# Patient Record
Sex: Female | Born: 1960 | Race: White | Hispanic: No | Marital: Married | Smoking: Never smoker
Health system: Southern US, Community
[De-identification: ages and names within clinical notes are randomized; demographics above are authoritative.]

## PROBLEM LIST (undated history)

## (undated) DIAGNOSIS — F0280 Dementia in other diseases classified elsewhere without behavioral disturbance: Secondary | ICD-10-CM

## (undated) DIAGNOSIS — E7800 Pure hypercholesterolemia, unspecified: Secondary | ICD-10-CM

## (undated) DIAGNOSIS — C4491 Basal cell carcinoma of skin, unspecified: Secondary | ICD-10-CM

## (undated) DIAGNOSIS — I10 Essential (primary) hypertension: Secondary | ICD-10-CM

## (undated) HISTORY — DX: Dementia in other diseases classified elsewhere, unspecified severity, without behavioral disturbance, psychotic disturbance, mood disturbance, and anxiety: F02.80

## (undated) HISTORY — DX: Pure hypercholesterolemia, unspecified: E78.00

## (undated) HISTORY — DX: Basal cell carcinoma of skin, unspecified: C44.91

## (undated) HISTORY — DX: Essential (primary) hypertension: I10

---

## 1997-12-02 ENCOUNTER — Inpatient Hospital Stay (HOSPITAL_COMMUNITY): Admission: AD | Admit: 1997-12-02 | Discharge: 1997-12-06 | Payer: Self-pay | Admitting: Obstetrics and Gynecology

## 1998-06-09 ENCOUNTER — Other Ambulatory Visit: Admission: RE | Admit: 1998-06-09 | Discharge: 1998-06-09 | Payer: Self-pay | Admitting: Obstetrics and Gynecology

## 1999-12-01 ENCOUNTER — Ambulatory Visit (HOSPITAL_COMMUNITY): Admission: RE | Admit: 1999-12-01 | Discharge: 1999-12-01 | Payer: Self-pay | Admitting: Obstetrics and Gynecology

## 2000-02-23 ENCOUNTER — Ambulatory Visit (HOSPITAL_COMMUNITY): Admission: RE | Admit: 2000-02-23 | Discharge: 2000-02-23 | Payer: Self-pay | Admitting: *Deleted

## 2000-05-14 ENCOUNTER — Inpatient Hospital Stay (HOSPITAL_COMMUNITY): Admission: AD | Admit: 2000-05-14 | Discharge: 2000-05-17 | Payer: Self-pay | Admitting: Obstetrics and Gynecology

## 2000-05-14 ENCOUNTER — Encounter (INDEPENDENT_AMBULATORY_CARE_PROVIDER_SITE_OTHER): Payer: Self-pay | Admitting: Specialist

## 2001-06-25 ENCOUNTER — Other Ambulatory Visit: Admission: RE | Admit: 2001-06-25 | Discharge: 2001-06-25 | Payer: Self-pay | Admitting: Obstetrics and Gynecology

## 2002-07-11 ENCOUNTER — Other Ambulatory Visit: Admission: RE | Admit: 2002-07-11 | Discharge: 2002-07-11 | Payer: Self-pay | Admitting: Obstetrics and Gynecology

## 2003-01-28 ENCOUNTER — Encounter: Admission: RE | Admit: 2003-01-28 | Discharge: 2003-01-28 | Payer: Self-pay | Admitting: Family Medicine

## 2003-01-28 ENCOUNTER — Encounter: Payer: Self-pay | Admitting: Family Medicine

## 2003-09-09 ENCOUNTER — Other Ambulatory Visit: Admission: RE | Admit: 2003-09-09 | Discharge: 2003-09-09 | Payer: Self-pay | Admitting: Obstetrics and Gynecology

## 2004-10-19 ENCOUNTER — Other Ambulatory Visit: Admission: RE | Admit: 2004-10-19 | Discharge: 2004-10-19 | Payer: Self-pay | Admitting: Obstetrics and Gynecology

## 2007-03-06 ENCOUNTER — Encounter: Admission: RE | Admit: 2007-03-06 | Discharge: 2007-03-06 | Payer: Self-pay | Admitting: Obstetrics and Gynecology

## 2007-12-11 ENCOUNTER — Encounter: Admission: RE | Admit: 2007-12-11 | Discharge: 2007-12-11 | Payer: Self-pay | Admitting: Obstetrics and Gynecology

## 2010-04-30 ENCOUNTER — Encounter: Payer: Self-pay | Admitting: Family Medicine

## 2010-05-01 ENCOUNTER — Encounter: Payer: Self-pay | Admitting: Obstetrics and Gynecology

## 2010-08-26 NOTE — H&P (Signed)
[facility]  Patient:    Sharon Evans, Sharon Evans                        MRN: [id number] Adm. Date:  05/14/00 Attending:  Juluis Mire, M.D.                         History and Physical  HISTORY:                      The patient is a 50 year old gravida 3, para 1, AB 1, married white female with last menstrual period on Aug 15, 1999, giving an estimated date of confinement of May 20, 2000.  This gives her estimated gestational age of [redacted] weeks, consistent with initial examination and ultrasound.  The patient presents for a repeat cesarean section and bilateral tubal ligation.  The patient had a prior low transverse cesarean section for a breech presentation with the last pregnancy.  We had discussed with her the possible trial of labor, which she does decline, and presents now for a repeat cesarean section.  Other complicating factors include advanced maternal age.  She did undergo amniocentesis, volume and chromosomally normal female.  Maternal amniotic fluid alpha fetoprotein was also within normal limits.  Her prenatal course was otherwise uncomplicated.  She is desirous of a permanent sterilization.  Alternative forms of birth control have been discussed.  The potentially reversibility of the sterilization is explained.  A failure rate of 1/200 is quoted.  This can be in the form of ectopic pregnancy, requiring further surgical management.  ALLERGIES:                    No known drug allergies.  CURRENT MEDICATIONS:          Prenatal vitamins.  PAST MEDICAL HISTORY/FAMILY HISTORY/SOCIAL HISTORY:  Please see the prenatal records.  REVIEW OF SYSTEMS:            Noncontributory.  PHYSICAL EXAMINATION:  VITAL SIGNS:                  Afebrile with stable vital signs.  HEENT:                        The patient is normocephalic.  Pupils equal, round, reactive to light and accommodation.  Extraocular movements intact. Sclerae and conjunctivae clear.   Oropharynx clear.  NECK:                         Without thyromegaly.  BREASTS:                      Glandular, but no discrete masses.  LUNGS:                        Clear.  CARDIAC:                      A regular rhythm and rate with a grade 2/6 systolic ejection murmur.  No clicks or gallops.  ABDOMEN:                      Gravid uterus, consistent with dates.  Fetal heart tones are audible.  PELVIC:  Deferred.  EXTREMITIES:                  Trace edema.  NEUROLOGIC:                   Grossly within normal limits.  IMPRESSION:                   1. Intrauterine pregnancy at term, with prior                                  cesarean section, desires repeat.                               2. Multiparity, desires sterility.  PLAN:                         The patient is to undergo a repeat cesarean section and bilateral tubal ligation.  The risks of surgery have been discussed, including the risk of anesthesia, the risk of infection, the risk of hemorrhage that could necessitate transfusion, with the risk of AIDS or hepatitis, the risk of injury to adjacent organs, including bladder, bowel, or ureters, which require further exploratory surgery and management, the risk of deep vein thrombosis and pulmonary embolus.  The patient expressed an understanding of the indications and risks. DD:  05/14/00 TD:  05/14/00 Job: [id number]

## 2010-08-26 NOTE — Op Note (Signed)
[facility]  Patient:    Sharon Evans, Sharon Evans                      MRN: [id number] Proc. Date: 05/14/00 Adm. Date:  [id number] Attending:  Frederich Balding                           Operative Report  PREOPERATIVE DIAGNOSES:       1. Uterine pregnancy at term with prior cesarean                                  section, desires repeat.                               2. Multiparity, desires sterility.  POSTOPERATIVE DIAGNOSES:      1. Uterine pregnancy at term with prior cesarean                                  section, desires repeat.                               2. Multiparity, desires sterility.  OPERATION:                    1. Low transverse cesarean section.                               2. Bilateral tubal ligation.  SURGEON:                      Juluis Mire, M.D.  ASSISTANT:                    Beather Arbour. Thomasena Edis, M.D., Ph.D.  Bronwen Betters BLOOD LOSS:         800 cc.  PACKS/DRAINS:                 None.  INTRAOPERATIVE BLOOD PLACED:  None.  COMPLICATIONS:                None.  INDICATIONS:                  As dictated in the history and physical.  DESCRIPTION OF PROCEDURE:     The patient was taken to the operating room and placed in the supine position with a left lateral tilt.  After the satisfactory level of spinal anesthesia was obtained, the abdomen was prepped out with Betadine and draped as a sterile field.  The prior low transverse skin incision was identified and excised.  The incision was then extended through the subcutaneous tissue.  The anterior rectus fascia was entered sharply and the incisions in the fascia extended laterally.  The fascia was taken off of the muscles superiorly and inferiorly.  The rectus muscles were separated in the midline.  The perineum was entered sharply, and the incision in the perineum was extended both superiorly and inferiorly.  There was no evidence of any intra-abdominal adhesions.  A low  transverse bladder flap was developed.  A low transverse uterine incision was then begun with the knife  and extended laterally using manual traction.  The infant did present in the vertex presentation, and was delivered with elevation of the head and fundal pressure.  The infant was a viable female who weighed 8 pounds and 9 ounces. The Apgars were 8/9.  The umbilical artery pH is still pending. The amniotic fluid was noted to be clear.  There was one nuchal cord that was easily reduced.  The placenta was then delivered manually and felt to be unremarkable.  The uterus was closed with interlocking sutures of #0 chromic using a two-layer technique.  With this we had good hemostasis.  The uterus was then exteriorized.  A small subserosal fibroid was noted.  Each tube was identified and elevated.  Holes made in the avascular area of the mesosalpinx. Individual ligatures of #0 plain catgut were used to ligate off the segment of tube.  The intervening segment of tube was then excised, and the cut ends of the tubes were cauterized.  We had good separation and hemostasis.  The ovaries appeared normal.  The uterus was then returned to the abdominal cavity.  The pelvic cavity was thoroughly irrigated.  Our hemostasis was excellent.  The urine output was clear and adequate.  The muscles were reapproximated with running suture of #3-0 Vicryl, and the fascia was closed with running sutures of #0 Panacryl.  The skin was closed with staples and Steri-Strips.  The sponge, instrument, and needle counts were reported as correct by the circulating nurse x 2.  The Foley catheter remained clear at the time of closure.  The patient tolerated the procedure well and was returned to the recovery room in good condition. DD:  05/14/00 TD:  05/14/00 Job: [id number]

## 2010-08-26 NOTE — Discharge Summary (Signed)
[facility]   Patient:    Sharon Evans, Sharon Evans                      MRN: [id number] Adm. Date:  [id number] Disc. Date: [id number] Attending:  Frederich Balding Dictator:   Danie Chandler, R.N.                           Discharge Summary  ADMISSION DIAGNOSES:          1. Intrauterine pregnancy at term with prior                                  cesarean section, desires repeat.                               2. Multiparity, desires sterility.  DISCHARGE DIAGNOSES:          1. Intrauterine pregnancy at term with prior                                  cesarean section, desires repeat.                               2. Multiparity, desires sterility.  PROCEDURES:                   On May 14, 2000, repeat low transverse cesarean section and bilateral tubal ligation.  REASON FOR ADMISSION:         Please see the dictated H&P.  HOSPITAL COURSE:              The patient was taken to the operating room and underwent the above-named procedure without complication.  This was productive of a viable female infant with Apgars of 8 at one minute and 9 at five minutes. Postoperatively on day #1, the patients hemoglobin was 10.1, hematocrit 29.4, and white blood cell count 10.1.  The patient was ambulating well in the halls.  On postoperative day #2, the patient had a good return of bowel function and was tolerating a regular diet.  She also had good pain control.  DISPOSITION:                  She was discharged home on postoperative day #3.  CONDITION ON DISCHARGE:       Good.  DIET:                         Regular as tolerated.  ACTIVITY:                     No heavy lifting, no driving, and no vaginal entry.  FOLLOW-UP:                    She is to follow up in the office in one to two weeks for an incision check.  DISCHARGE INSTRUCTIONS:       She is to call for temperature greater than 100 degrees, persistent nausea or vomiting, heavy vaginal bleeding,  and/or redness or drainage from the incision site.  DISCHARGE MEDICATIONS:        1. Prenatal  vitamins one p.o. q.d.                               2. Percocet 5 mg, #30, one to two p.o. q.4-6h.                                  p.r.n. pain. DD:  05/17/00 TD:  05/18/00 Job: [id number]

## 2013-12-02 ENCOUNTER — Other Ambulatory Visit: Payer: Self-pay | Admitting: Family Medicine

## 2013-12-02 ENCOUNTER — Ambulatory Visit
Admission: RE | Admit: 2013-12-02 | Discharge: 2013-12-02 | Disposition: A | Discharge status: Home / Self Care | Payer: Managed Care, Other (non HMO) | Source: Ambulatory Visit | Attending: Family Medicine | Admitting: Family Medicine

## 2013-12-02 DIAGNOSIS — M79645 Pain in left finger(s): Secondary | ICD-10-CM

## 2014-06-12 ENCOUNTER — Other Ambulatory Visit: Payer: Self-pay

## 2014-06-12 DIAGNOSIS — Z1231 Encounter for screening mammogram for malignant neoplasm of breast: Secondary | ICD-10-CM

## 2014-08-13 ENCOUNTER — Ambulatory Visit
Admission: RE | Admit: 2014-08-13 | Discharge: 2014-08-13 | Disposition: A | Discharge status: Home / Self Care | Payer: Managed Care, Other (non HMO) | Source: Ambulatory Visit

## 2014-08-13 DIAGNOSIS — Z1231 Encounter for screening mammogram for malignant neoplasm of breast: Secondary | ICD-10-CM

## 2016-04-17 DIAGNOSIS — Z01419 Encounter for gynecological examination (general) (routine) without abnormal findings: Secondary | ICD-10-CM | POA: Diagnosis not present

## 2016-04-17 DIAGNOSIS — Z6823 Body mass index (BMI) 23.0-23.9, adult: Secondary | ICD-10-CM | POA: Diagnosis not present

## 2016-04-21 DIAGNOSIS — Z13820 Encounter for screening for osteoporosis: Secondary | ICD-10-CM | POA: Diagnosis not present

## 2016-05-02 DIAGNOSIS — M810 Age-related osteoporosis without current pathological fracture: Secondary | ICD-10-CM | POA: Diagnosis not present

## 2016-05-02 DIAGNOSIS — M818 Other osteoporosis without current pathological fracture: Secondary | ICD-10-CM | POA: Diagnosis not present

## 2016-05-04 DIAGNOSIS — E7800 Pure hypercholesterolemia, unspecified: Secondary | ICD-10-CM | POA: Diagnosis not present

## 2016-05-04 DIAGNOSIS — R413 Other amnesia: Secondary | ICD-10-CM | POA: Diagnosis not present

## 2016-05-04 DIAGNOSIS — I10 Essential (primary) hypertension: Secondary | ICD-10-CM | POA: Diagnosis not present

## 2016-05-04 DIAGNOSIS — Z0000 Encounter for general adult medical examination without abnormal findings: Secondary | ICD-10-CM | POA: Diagnosis not present

## 2016-05-24 IMAGING — MG MM SCREEN MAMMOGRAM BILATERAL
5 series · 5 of 5 positions shown · non-contrast
Comparison: Previous exam(s).

CLINICAL DATA: Screening.

EXAM:
DIGITAL SCREENING BILATERAL MAMMOGRAM WITH CAD

[R CC]
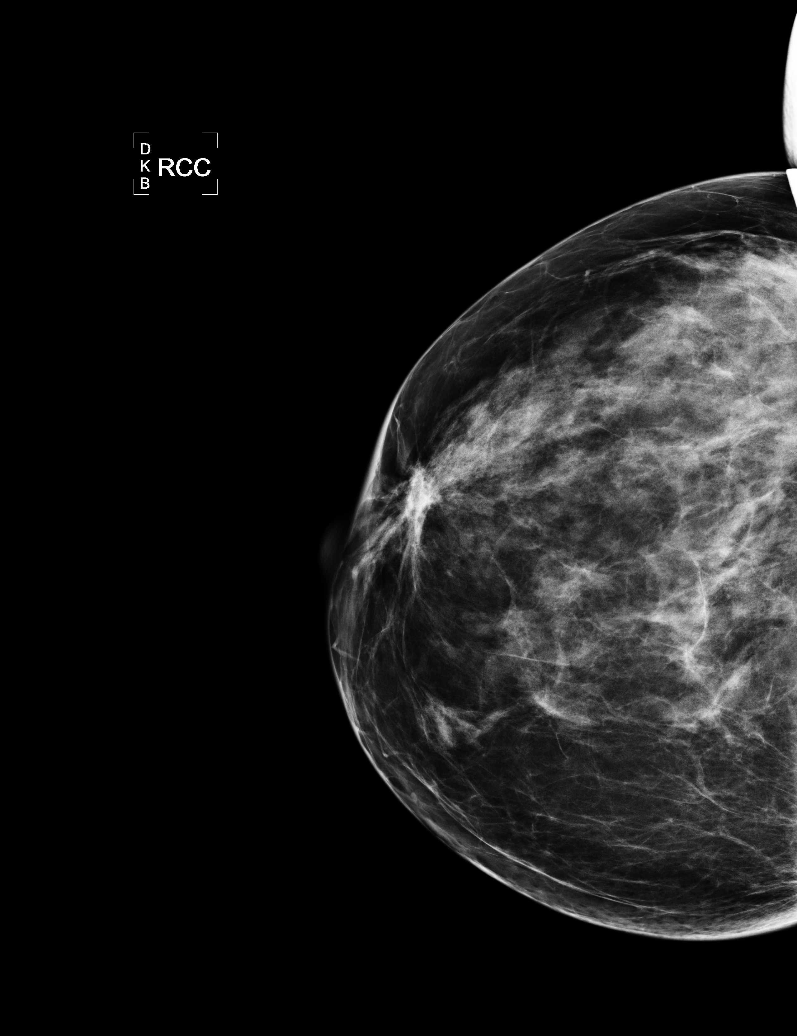

[L CC]
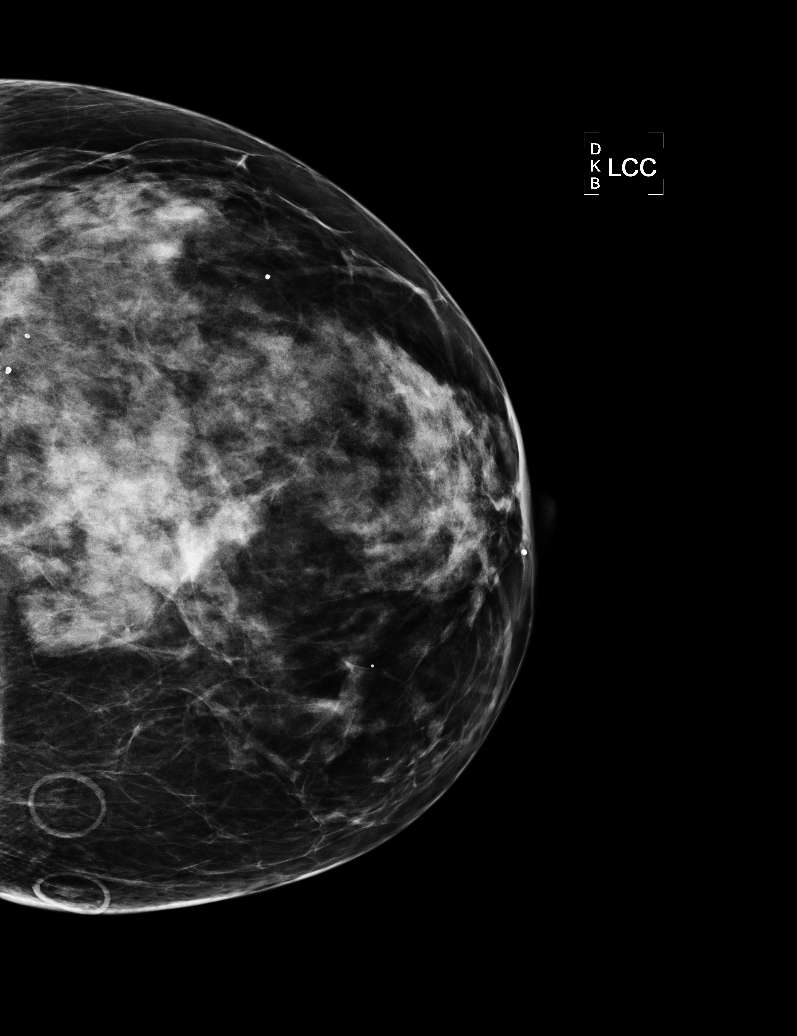

[L MLO (1 of 2)]
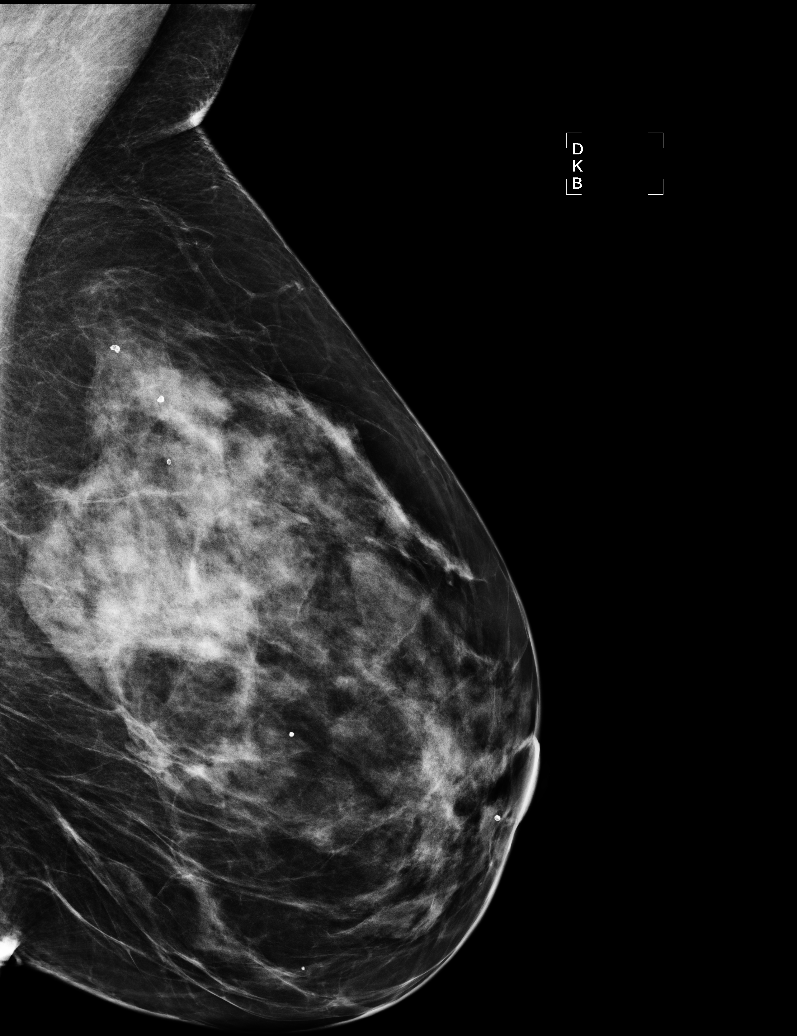

[R MLO]
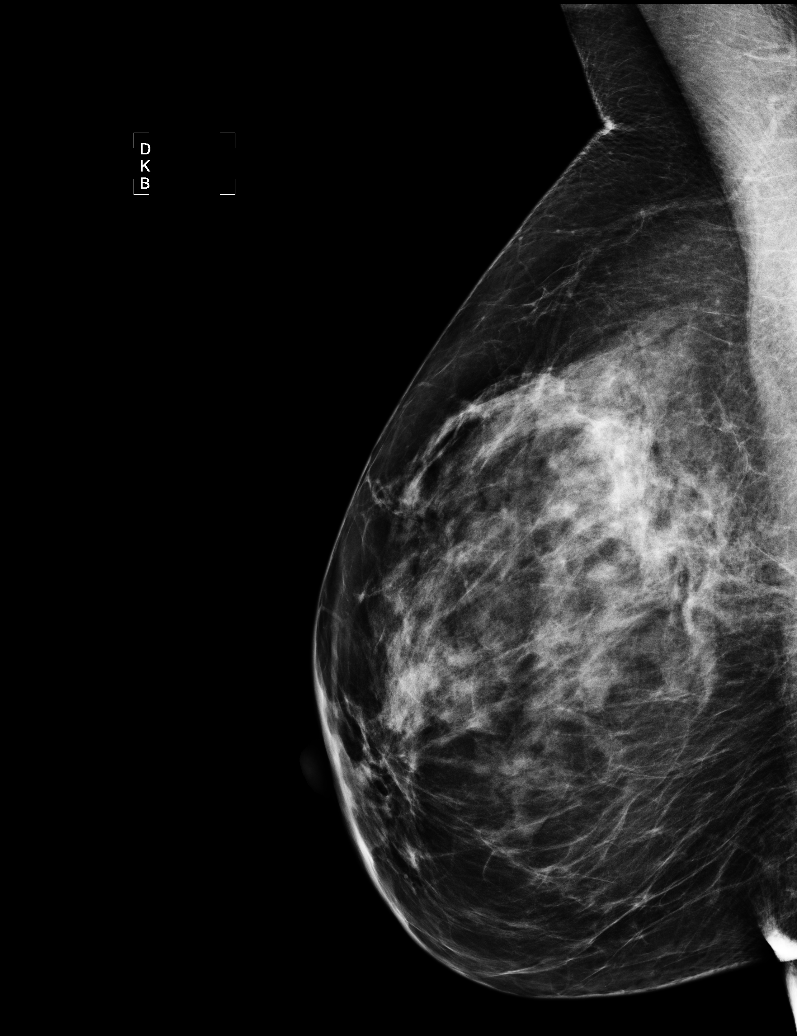

[L MLO (2 of 2)]
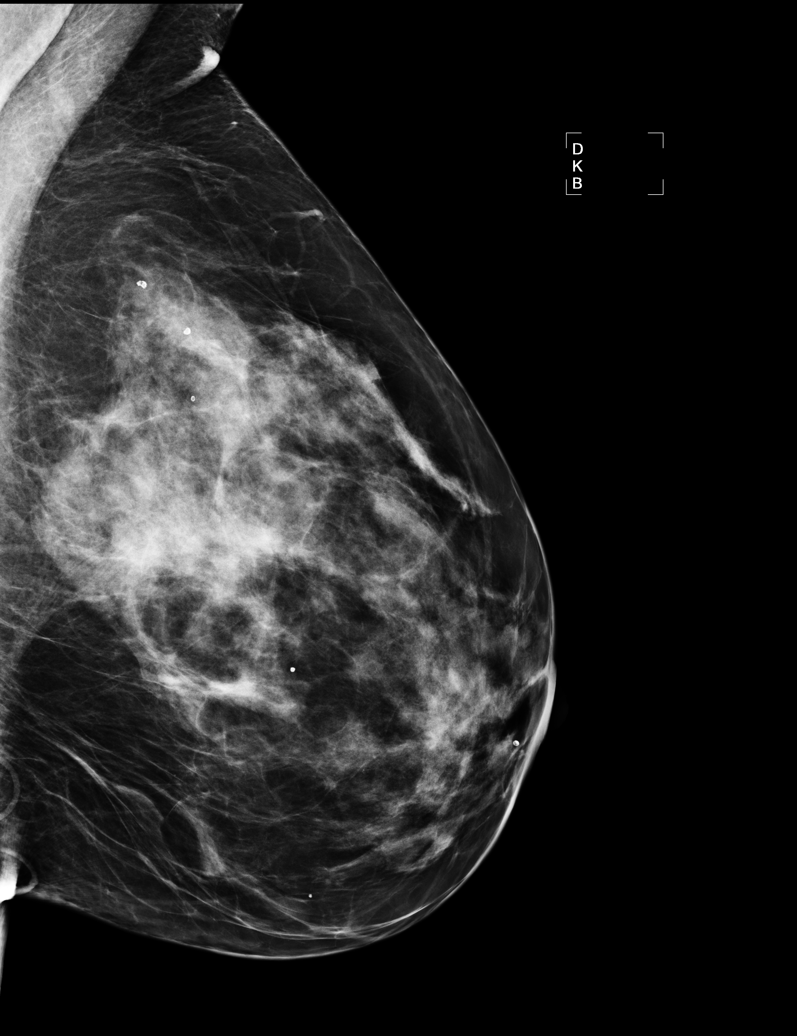

[5 of 5 positions shown; findings below may reference images not displayed]

ACR Breast Density Category c: The breast tissue is heterogeneously
dense, which may obscure small masses.
FINDINGS: There are no findings suspicious for malignancy. Images were
processed with CAD.
IMPRESSION: No mammographic evidence of malignancy. A result letter of this
screening mammogram will be mailed directly to the patient.

RECOMMENDATION:
Screening mammogram in one year. (Code:YJ-2-FEZ)

BI-RADS CATEGORY  1: Negative.

## 2016-06-29 DIAGNOSIS — F411 Generalized anxiety disorder: Secondary | ICD-10-CM | POA: Diagnosis not present

## 2016-07-31 DIAGNOSIS — E559 Vitamin D deficiency, unspecified: Secondary | ICD-10-CM | POA: Diagnosis not present

## 2017-01-29 DIAGNOSIS — R413 Other amnesia: Secondary | ICD-10-CM | POA: Diagnosis not present

## 2017-01-29 DIAGNOSIS — F411 Generalized anxiety disorder: Secondary | ICD-10-CM | POA: Diagnosis not present

## 2017-01-29 DIAGNOSIS — R251 Tremor, unspecified: Secondary | ICD-10-CM | POA: Diagnosis not present

## 2017-01-29 DIAGNOSIS — R634 Abnormal weight loss: Secondary | ICD-10-CM | POA: Diagnosis not present

## 2017-01-30 ENCOUNTER — Encounter: Payer: Self-pay | Admitting: Neurology

## 2017-02-06 ENCOUNTER — Ambulatory Visit (INDEPENDENT_AMBULATORY_CARE_PROVIDER_SITE_OTHER): Payer: BLUE CROSS/BLUE SHIELD | Admitting: Neurology

## 2017-02-06 ENCOUNTER — Encounter: Payer: Self-pay | Admitting: Neurology

## 2017-02-06 VITALS — BP 160/90 | HR 77 | Ht 64.5 in | Wt 123.8 lb

## 2017-02-06 DIAGNOSIS — R4189 Other symptoms and signs involving cognitive functions and awareness: Secondary | ICD-10-CM | POA: Diagnosis not present

## 2017-02-06 DIAGNOSIS — R030 Elevated blood-pressure reading, without diagnosis of hypertension: Secondary | ICD-10-CM | POA: Diagnosis not present

## 2017-02-06 DIAGNOSIS — R413 Other amnesia: Secondary | ICD-10-CM | POA: Diagnosis not present

## 2017-02-06 MED ORDER — DIAZEPAM 5 MG PO TABS: ORAL_TABLET | ORAL | 0 refills | Status: DC
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
## 2017-02-06 NOTE — Patient Instructions (Addendum)
1.  We will check MRI of brain with and without contrast.  Take Valium 5mg  15 to 30 minutes prior to MRI 2.  We will also schedule a neurocognitive test with Dr. Si Raider 3.  No driving at this time 4.  Follow up with me after testing. 5.  Follow up blood pressure with Dr. Marisue Humble.

## 2017-02-19 ENCOUNTER — Ambulatory Visit
Admission: RE | Admit: 2017-02-19 | Discharge: 2017-02-19 | Disposition: A | Discharge status: Home / Self Care | Payer: BLUE CROSS/BLUE SHIELD | Source: Ambulatory Visit | Attending: Neurology | Admitting: Neurology

## 2017-02-19 DIAGNOSIS — R413 Other amnesia: Secondary | ICD-10-CM

## 2017-02-19 DIAGNOSIS — R4189 Other symptoms and signs involving cognitive functions and awareness: Secondary | ICD-10-CM

## 2017-02-19 MED ORDER — GADOBENATE DIMEGLUMINE 529 MG/ML IV SOLN
10.0000 mL | Freq: Once | INTRAVENOUS | Status: AC | PRN
  Administered 2017-02-19: 10 mL via INTRAVENOUS

## 2017-02-22 ENCOUNTER — Telehealth: Payer: Self-pay | Admitting: Neurology

## 2017-02-22 ENCOUNTER — Telehealth: Payer: Self-pay

## 2017-02-22 NOTE — Telephone Encounter (Signed)
-----   Message from Pieter Partridge, DO sent at 02/20/2017  7:19 AM EST ----- The MRI of the brain does not reveal anything specific that would cause memory problems.  She should follow up with Dr. Si Raider as scheduled and then with me.

## 2017-02-22 NOTE — Telephone Encounter (Signed)
Called and spoke with Husband, [name]. He wants to be placed on cx list for neuropsych testing

## 2017-02-22 NOTE — Telephone Encounter (Signed)
Patient's husband called and would like you to call him on his Cell phone. Thanks

## 2017-02-22 NOTE — Telephone Encounter (Signed)
Called Pt, LM on VM advisig Pt of MRI results and to follow-up with Dr Richrd Sox and Tomi Likens

## 2017-02-22 NOTE — Telephone Encounter (Signed)
Want to know the results of the MRI please call

## 2017-02-27 ENCOUNTER — Telehealth: Payer: Self-pay

## 2017-02-27 ENCOUNTER — Telehealth: Payer: Self-pay | Admitting: Neurology

## 2017-02-27 MED ORDER — DONEPEZIL HCL 5 MG PO TABS: 5.0000 mg | ORAL_TABLET | Freq: Every day | ORAL | 0 refills | Status: DC

## 2017-02-27 MED ORDER — DONEPEZIL HCL 5 MG PO TABS: 5.0000 mg | ORAL_TABLET | Freq: Every day | ORAL | 3 refills | Status: DC

## 2017-02-27 NOTE — Telephone Encounter (Signed)
Bill rtrnd call, advsd him of 03/19/17 date and to arrive at 1:45p, advsd him also will be here for a couple of hours. He wants Aricept sent to [facility].

## 2017-02-27 NOTE — Addendum Note (Signed)
Addended by: Clois Comber on: 02/27/2017 09:55 AM   Modules accepted: Orders

## 2017-02-27 NOTE — Telephone Encounter (Signed)
Called Pts husband, [name], [place] on VM advsing the neurocognitive testing date has been moved up to 03/19/17, also we need which pharmacy to call in Aricept.

## 2017-02-27 NOTE — Telephone Encounter (Signed)
I called and spoke with Mr. Louie Bun regarding his wife's MRI.  There is nothing specific on the test.  At this point, she is scheduled for neuropsych testing in April.  I will see if I can get that appointment moved up.  If not, then I would like to refer her out to another practice, such as [facility], if a sooner appointment is available.  In the meantime, we will start donepezil.  I discussed the function and side effects.  We will start 5mg  at bedtime for one month.  If tolerating, he will call for refill of 10mg  dose.

## 2017-03-19 ENCOUNTER — Encounter: Payer: BLUE CROSS/BLUE SHIELD | Admitting: Psychology

## 2017-03-23 ENCOUNTER — Ambulatory Visit (INDEPENDENT_AMBULATORY_CARE_PROVIDER_SITE_OTHER): Payer: BLUE CROSS/BLUE SHIELD | Admitting: Psychology

## 2017-03-23 ENCOUNTER — Encounter: Payer: Self-pay | Admitting: Psychology

## 2017-03-23 DIAGNOSIS — R413 Other amnesia: Secondary | ICD-10-CM
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
## 2017-03-24 NOTE — Progress Notes (Signed)
   Neuropsychology Note  Sharon Evans came in today for 1 hour of neuropsychological testing with technician, Milana Kidney, BS, under the supervision of Dr. Macarthur Critchley. The patient did not appear overtly distressed by the testing session, per behavioral observation or via self-report to the technician. Rest breaks were offered. Sharon Evans will return within 2 weeks for a feedback session with Dr. Si Raider at which time her test performances, clinical impressions and treatment recommendations will be reviewed in detail. The patient understands she can contact our office should she require our assistance before this time.  Full report to follow.

## 2017-03-25 ENCOUNTER — Encounter: Payer: Self-pay | Admitting: Psychology

## 2017-04-15 NOTE — Progress Notes (Addendum)
NEUROPSYCHOLOGICAL EVALUATION   Name:    Sharon Evans  Date of Birth:   1960/11/19 Date of Interview:  03/23/2017 Date of Testing:  03/23/2017   Date of Feedback:  04/16/2017       Background Information:  Reason for Referral:  Sharon Evans is a 57 y.o. right-handed female referred by Dr. Metta Clines to assess her current level of cognitive functioning and assist in differential diagnosis. The current evaluation consisted of a review of available medical records, an interview with the patient and her husband, [name], and the completion of a neuropsychological testing battery. Informed consent was obtained.  History of Presenting Problem:  Earlier this year the patient apparently saw Dr. Marisue Humble for memory concerns. She appeared anxious and tremulous so she was prescribed Lexapro. Her husband didn't initially know about that visit or her concern of memory loss. Then he became concerned about her memory when, on a few occasions, she had tried to unlock her car to take it somewhere and her husband had to remind her that she didn't have the keys. They went to see Dr. Marisue Humble together, and that's when the patient was referred to Dr. Tomi Likens for further evaluation. Sharon Evans saw Dr. Tomi Likens on 02/06/2017. 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 Brain MRI was performed on 02/19/2017 and reportedly revealed no specific or reversible explanation for memory loss, mild cortical volume loss along the convexities, normal medial temporal volume, and no notable ischemic change.   At today's visit (03/23/2017), the patient has difficulty providing information about her current cognitive functioning. When asked if she has concerns about her memory/cognition, she replies "somewhat". She explained that it is not an every-day concern, and she can function pretty independently. She is unable to provide any examples of even episodic  cognitive changes. Her husband reports that a major issue is word finding difficulty. She often is unable to come up with the last one or two words of a sentence. He says this happens at least twice a day. He doesn't notice any word substitution problems or paraphasic errors. He doesn't notice any comprehension or receptive language problems. The patient states that she has trouble writing now and actually has pretty much stopped writing. Her husband states she used to have beautiful penmanship. 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 She admits she may forget details of recent conversations. She does not seem to forget recent events. She denies any problems with concentration/attention or staying on task. Her husband notes that while she was never a tech-savvy person, she does seem to need more help even with the basics of her smart phone lately. For example, she has asked where to locate her text messages.   She denies any difficulty with visual-spatial navigation or history of getting lost. Dr. Georgie Chard note indicated that her husband has not been concerned riding in the passenger seat with her, but that one time she was driving in the car with her son when she missed a turn and had to drive the wrong way on a street until she was able to maneuver and turn around. Due to her poor performance on the MoCA, Dr. Tomi Likens advised that she stop driving. She has not driven since then. This of course is frustrating to her as she feels she has much less independence and is "going stir-crazy" in the house. Her husband  states that she has good awareness of routes and where they are when she is a passenger with him.   Psychiatric history, including history of depression or mental health treatment for any issue, was denied.  There is no family history of dementia (including early onset dementia). Her father was very young when he passed away. Her mother  passed in her 43s and did not have dementia. Her brother passed away 9 years ago; he had heart issues and depression but no cognitive problems.   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 Most of the work can be done from home. She reports she enjoys it very much. She denied any problems doing her job earlier this year, and she hopes to return in 2019 when the season comes around again.   As noted previously, she is not currently driving, per Dr. Georgie Chard recommendation. She manages her medications independently and denies any difficulty with this. Her husband has always managed the majority of the finances/bills (he is a Customer service manager), but she always managed her credit cards. There was an issue sometime in the past 6 months when she didn't seem aware of her credit card balance and did not pay it off, which is new. She independently manages her appointments and denies any problems with this or missed appointments. She never enjoyed cooking and never did very much of it, but now her husband is doing pretty much all of it. Per Dr. Georgie Chard note, on a couple of occasions, she heated food in a ceramic bowl on the stove and burnt the bowl.   The patient denies any physical complaints or pain at the present time. She denies trouble with balance or changes in gait. She denies any sleep difficulties. Her husband has not noticed any increased movement in sleep or signs of REM sleep behavior disorder. He states she does seem to go to bed earlier than she used to.   She reports her appetite is fine. Her husband has been concerned about her losing weight. She has lost weight over the past 2 years but she states this was intentional and she doesn't think it is "overdone". She does eat fairly healthy foods, per her husband. She does not engage in any regular physical exercise.  She reports her current mood is good  but "a little stir crazy". Her husband denies any change in mood, irritability or depression. He feels her "spirits have been good". It is difficult to assess her anxiety level. She appears anxious/fidgety, but she denies feeling anxious or jittery. She denies feeling particularly worried. Her husband notes that she has made more comments lately about different things stressing her out. He has not observed any personality changes or changes in interpersonal/social functioning. He notes one behavioral change in that she used to be more particular with her clothes and would hang them up, but now she seems to leave them folded in piles.   Social History: Born/Raised: [place] Education: Bachelors degree from 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 More recently, she has done seasonal work from home for [employer], for at least 5-6 years. Marital history: Married x20 years. Two sons- one is a Paramedic in [employer], other is in college at [employer] Alcohol: Minimal, maybe one beer once a week (social) Tobacco: Never   Medical History: No past medical history on file.  Current medications:  Outpatient  Encounter Medications as of 04/16/2017  Medication Sig  . alendronate (FOSAMAX) 70 MG tablet Take 70 mg by mouth once a week.  . diazepam (VALIUM) 5 MG tablet Take 15 to 30 minute prior to MRI  . donepezil (ARICEPT) 5 MG tablet Take 1 tablet (5 mg total) at bedtime by mouth.  . donepezil (ARICEPT) 5 MG tablet Take 1 tablet (5 mg total) at bedtime by mouth.  . escitalopram (LEXAPRO) 10 MG tablet Take 10 mg by mouth daily.   No facility-administered encounter medications on file as of 04/16/2017.      Current Examination:  Behavioral Observations:  Appearance: Neatly, casually and appropriately dressed and groomed Gait: Ambulated independently, no gross abnormalities observed Speech: Aphasic/dysfluent. Significant word finding difficulty and  difficulty expressing ideas and responding to questions. Occasional paraphasic errors (e.g., "losed" instead of "lost"; "Bachelor of bachelor degree"). Thought process: Difficult to ascertain, appears linear Affect: Full, appears anxious Interpersonal: Pleasant, appropriate Orientation: Oriented to person, place, current month and day of the week. Disoriented to date and year. Accurately named the current President and his predecessor.  Pt was able to comprehend test instructions for the most part but often demonstrated apraxia, particularly on tasks requiring her to draw or manipulate test stimuli with her hands  Tests Administered: . Test of Premorbid Functioning (TOPF) . Wechsler Adult Intelligence Scale-Fourth Edition (WAIS-IV): Similarities, Music therapist, Coding and Digit Span subtests . [employer] Verbal Learning Test - 2nd Edition (CVLT-2) Short Form . Repeatable Battery for the Assessment of Neuropsychological Status (RBANS) Form A:  Story Memory and Story Recall subtests, Figure Copy and Recall subtests and Semantic Fluency subtest . Neuropsychological Assessment Battery (NAB) Language Module, Form 1:Auditory Comprehension and  Naming subtests . Controlled Oral Word Association Test (COWAT) . Trail Making Test A and B . Clock drawing test . Geriatric Depression Scale (GDS) 15 Item . Generalized Anxiety Disorder - 7 item screener (GAD-7)  Test Results: Note: Standardized scores are presented only for use by appropriately trained professionals and to allow for any future test-retest comparison. These scores should not be interpreted without consideration of all the information that is contained in the rest of the report. The most recent standardization samples from the test publisher or other sources were used whenever possible to derive standard scores; scores were corrected for age, gender, ethnicity and education when available.   Test Scores:  Test Name Raw Score Standardized Score  Descriptor  TOPF 36/70 SS= 95 Average  WAIS-IV Subtests     Similarities 10/36 ss= 3 Impaired  Block Design Pt unable    Coding Pt unable    Digit Span Forward 8/16 ss= 8 Low end of average  Digit Span Backward 3/16 ss= 3 Impaired  RBANS Subtests     Story Memory 2/24 Z= -4.2 Severely impaired  Story Recall 0/12 Z= -4.1 Severely impaired  Figure Copy 0/20 Z= -13 Severely impaired  Figure Recall 0/20 Z= -4.1 Severely impaired  Semantic Fluency 6/40 Z= -3 Severely impaired  CVLT-II Scores     Trial 1 3/9 Z= -2.5 Impaired  Trial 4 4/9 Z= -3.5 Severely impaired  Trials 1-4 total 15/36 T= 17 Severely impaired  SD Free Recall 3/9 Z= -3 Severely impaired  LD Free Recall 2/9 Z= -3 Severely impaired  LD Cued Recall 3/9 Z= -2.5 Impaired  Recognition Discriminability 9/9 hits, 11 false positives Z= -2 Impaired  Forced Choice Recognition 8/9    NAB Language subtests     Auditory Comprehension 60/89 T= 19  Severely impaired  Naming 25/31 T= 19 Severely impaired  COWAT-FAS 16 T= 24 Severely impaired  COWAT-Animals 5 T= 19 Severely impaired  Trail Making Test A  Pt unable      Trail Making Test B  Pt unable     Clock Drawing   Impaired  GDS-15 2/15  WNL  GAD-7 0/21  WNL      Description of Test Results:  Premorbid verbal intellectual abilities were estimated to have been within the average range based on a test of word reading.   Psychomotor processing speed was unable to be assessed; she was unable to complete a coding task or Trails B, due to apraxia.   Basic auditory attention was within normal limits (low end of average), and she was able to repeat a string of five digits accurately. Meanwhile, more complex auditory attention (ie working memory) was impaired. Regarding the latter, she was able to accurately repeat only two digits backwards.   Visual-spatial construction was severely impaired seemingly due to severe apraxia; she was unable to place even two blocks together on the  [place] task, and she was unable to draw even a single line or copy even a simple symbol.   Language abilities were severely impaired. On a test of confrontation naming, she was able to name common, high-frequency items, such as "comb", "scissors", and "umbrella", as well as some lower-frequency items such as "crutch", "clothespin", and "canteen". However, her overall performance still fell within the severely impaired range for age. Items she was unable to name included "lime", "parachute", "ladle", "magnet" and "ostrich". Semantic verbal fluency was severely impaired as well. She named 5 animals in one minute, and 6 fruits/vegetables in one minute. Auditory comprehension was severely impaired. She was able to follow basic auditory commands (e.g., pointing to a color on a page, pointing to an item in the room, pointing to a shape on a page, pointing to a number on a page). However, with any additional complexity in syntax or any abstraction, she struggled (e.g., "point to the shape with three sides", "point to the square after you point to the circle"). She also had difficulty answering questions with complex ideational material (e.g., "Is 10 am earlier than noon?", "Is a daughter older than her mother?", "Is the [place]?" were all answered incorrectly).    With regard to verbal memory, encoding and acquisition of non-contextual information (i.e., word list) was severely impaired with a flat learning curve (3/9 items recalled on Trial 1, 4/9 items recalled on trial 4). After a brief distracter task, free recall was severely impaired but showed relatively good retention (3/9 items recalled). After a delay, free recall again was severely impaired but again showed relatively good retention (2/9 items recalled without cueing; 3/9 items recalled with semantic cueing). Performance on a yes/no recognition task was impaired due to a very high number of false positive responses. Forced  choice recognition also was impaired. On another verbal memory test, encoding and acquisition of contextual auditory information (i.e., short story) was severely impaired. After a delay, free recall was severely impaired with no recall of any aspects of the original story. Non-verbal memory was not able to be assessed, given her inability to draw the visual stimuli on the learning trial of the visual memory task.   On tasks measuring various aspects of executive functioning, the patient's performances were impaired. Mental flexibility and set-shifting were unable to be assessed on Trails B due to her apraxia.  Verbal fluency with phonemic search restrictions was severely impaired. Verbal abstract reasoning was impaired. She could not complete the clock drawing task due to apraxia.   On self-report measures of mood, the patient's responses were not indicative of clinically significant depression or anxiety at the present time. Of course, the validity of these measures depends on her ability to fully understand the questions asked of her (the examiner did ask her the questions orally and they required only yes/no answers).    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 Results of cognitive testing revealed severely and globally impaired cognitive function. All domains of cognitive functioning appear to be affected at this point, and her cognitive dysfunction is interfering with her ability to manage complex tasks such as managing finances, cooking/using the stove, and driving. She clearly meets diagnostic criteria for dementia, and I would characterize her dementia as moderate stage at this time based on the level of cognitive dysfunction on formal testing. Her cognitive profile, neuroimaging findings ruling out other etiologies, clinical features and age all suggest early onset Alzheimer's disease. The neuropsychological profile of early onset AD does differ  from the profile of the more commonly seen late onset AD, in that visual-spatial/parietal lobe function is more severely impaired in early onset cases and memory is not as affected in the early stages of early onset AD. These features are consistent with Mrs. Hajduk presentation and further support a diagnosis of early onset AD.     Recommendations/Plan: Based on the findings of the present evaluation, the following recommendations are offered:  1. The patient should not return to driving, due to the severity of global cognitive dysfunction on testing as well as more specific deficits in motor planning, executive functioning, and memory on testing. 2. It is my opinion that the patient would NOT be able to perform her work responsibilities for her seasonal job at [employer] due to the level and nature of cognitive dysfunction on this evaluation. She may wish to apply for disability benefits. 3. Her medications should be overseen by her husband or another caregiver. He or another caregiver should also help with managing appointments. She should never attend an appointment alone. 4. She should continue to refrain from cooking or using the stove/oven independently. 5. She would likely benefit from having a hired companion who is knowledgeable and experienced in working with clients with dementia come in the home on a regular basis to help provide companionship, mental/social stimulation and structure to her day. As her disease progresses, she will likely need increased care and supervision, and her family should begin planning for this.  6. Estate planning, power of attorney (for health care and for finances), and other advance care planning should be put in place if it has not already. 7. Her family will benefit from education and support regarding early onset AD. I will refer them to the Alzheimer's Association. I also provided them with written information from the Alzheimer's Association and  this document from [facility]: VIPSaver.fi 8. The patient appears to be an appropriate candidate for cholinesterase inhibitor therapy, and she is already prescribed Aricept.  9. Other potential considerations for the patient/family include genetic testing for early onset familial AD (I would recommend genetic counseling before doing so) and participating in research trials.   Feedback to Patient: Sharon Evans and her husband returned for a feedback appointment on 04/16/2017 to review the results of her neuropsychological evaluation with this provider. 70 minutes face-to-face  time was spent reviewing her test results, my impressions and my recommendations as detailed above.    Total time spent on this patient's case: 90791x1 unit for interview with psychologist (03/23/2017); [id number] unit of testing by psychometrician under psychologist's supervision (03/23/2017); [id number] and [id number] units (04/16/2017) for integration of patient data, interpretation of standardized test results and clinical data, clinical decision making, treatment planning and preparation of this report, and interactive feedback with review of results to the patient/family by psychologist.      Thank you for your referral of GEORGIAN MCCLORY. Please feel free to contact me if you have any questions or concerns regarding this report.

## 2017-04-16 ENCOUNTER — Ambulatory Visit (INDEPENDENT_AMBULATORY_CARE_PROVIDER_SITE_OTHER): Payer: BLUE CROSS/BLUE SHIELD | Admitting: Psychology

## 2017-04-16 ENCOUNTER — Encounter: Payer: Self-pay | Admitting: Psychology

## 2017-04-16 DIAGNOSIS — G300 Alzheimer's disease with early onset: Secondary | ICD-10-CM

## 2017-04-16 DIAGNOSIS — F0280 Dementia in other diseases classified elsewhere without behavioral disturbance: Secondary | ICD-10-CM | POA: Diagnosis not present

## 2017-04-16 NOTE — Patient Instructions (Signed)
Description of Test Results:  Premorbid verbal intellectual abilities were estimated to have been within the average range based on a test of word reading.   Psychomotor processing speed was unable to be assessed; she was unable to complete a coding task or Trails B, due to apraxia.   Basic auditory attention was within normal limits (low end of average), and she was able to repeat a string of five digits accurately. Meanwhile, more complex auditory attention (ie working memory) was impaired. Regarding the latter, she was able to accurately repeat only two digits backwards.   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   Language abilities were severely impaired. On a test of confrontation naming, she was able to name common, high-frequency items, such as "comb", "scissors", and "umbrella", as well as some lower-frequency items such as "crutch", "clothespin", and "canteen". However, her overall performance still fell within the severely impaired range for age. Items she was unable to name included "lime", "parachute", "ladle", "magnet" and "ostrich". Semantic verbal fluency was severely impaired as well. She named 5 animals in one minute, and 6 fruits/vegetables in one minute. Auditory comprehension was severely impaired. She was able to follow basic auditory commands (e.g., pointing to a color on a page, pointing to an item in the room, pointing to a shape on a page, pointing to a number on a page). However, with any additional complexity in syntax or any abstraction, she struggled (e.g., "point to the shape with three sides", "point to the square after you point to the circle"). She also had difficulty answering questions with complex ideational material (e.g., "Is 10 am earlier than noon?", "Is a daughter older than  her mother?", "Is the [place]?" were all answered incorrectly).    With regard to verbal memory, encoding and acquisition of non-contextual information (i.e., word list) was severely impaired with a flat learning curve (3/9 items recalled on Trial 1, 4/9 items recalled on trial 4). After a brief distracter task, free recall was severely impaired but showed relatively good retention (3/9 items recalled). After a delay, free recall again was severely impaired but again showed relatively good retention (2/9 items recalled without cueing; 3/9 items recalled with semantic cueing). Performance on a yes/no recognition task was impaired due to a very high number of false positive responses. Forced choice recognition also was impaired. On another verbal memory test, encoding and acquisition of contextual auditory information (i.e., short story) was severely impaired. After a delay, free recall was severely impaired with no recall of any aspects of the original story. Non-verbal memory was not able to be assessed, given her inability to draw the visual stimuli on the learning trial of the visual memory task.   On tasks measuring various aspects of executive functioning, the patient's performances were impaired. Mental flexibility and set-shifting were unable to be assessed on Trails B due to her apraxia. Verbal fluency with phonemic search restrictions was severely impaired. Verbal abstract reasoning was impaired. She could not complete the clock drawing task due to apraxia.   On self-report measures of mood, the patient's responses were not indicative of clinically significant depression or anxiety at the present time. Of course, the validity of these measures depends on her ability to fully understand the questions asked of her (the  examiner did ask her the questions orally and they required only yes/no answers).    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 Results of cognitive testing revealed severely and globally impaired cognitive function. All domains of cognitive functioning appear to be affected at this point, and her cognitive dysfunction is interfering with her ability to manage complex tasks such as managing finances, cooking/using the stove, and driving. She clearly meets diagnostic criteria for dementia, and I would characterize her dementia as moderate stage at this time based on the level of cognitive dysfunction on formal testing. Her cognitive profile, neuroimaging findings ruling out other etiologies, clinical features and age all suggest early onset Alzheimer's disease. The neuropsychological profile of early onset AD does differ from the profile of the more commonly seen late onset AD, in that visual-spatial/parietal lobe function is more severely impaired in early onset cases and memory is not as affected in the early stages of early onset AD. These features are consistent with Mrs. Cosper presentation and further support a diagnosis of early onset AD.     Recommendations/Plan: Based on the findings of the present evaluation, the following recommendations are offered:  1. The patient should not return to driving, due to the severity of global cognitive dysfunction on testing as well as more specific deficits in motor planning, executive functioning, and memory on testing. 2. 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 due to the level and nature of cognitive dysfunction on this evaluation. She may wish to apply for disability benefits. 3. Her medications should be overseen by her husband or another caregiver. He or another caregiver should also help with managing appointments. She should never attend an appointment alone. 4. She should continue to refrain from cooking or using the stove/oven independently. 5. She would likely  benefit from having a hired companion who is knowledgeable and experienced in working with clients with dementia come in the home on a regular basis to help provide companionship, mental/social stimulation and structure to her day. As her disease progresses, she will likely need increased care and supervision, and her family should begin planning for this.  6. Estate planning, power of attorney (for health care and for finances), and other advance care planning should be put in place if it has not already. 7. Her family will benefit from education and support regarding early onset AD. I will refer them to the Alzheimer's Association. I also provided them with written information from the Alzheimer's Association and this document from [facility]: [email or web address] 8. The patient appears to be an appropriate candidate for cholinesterase inhibitor therapy, and she is already prescribed Aricept.  9. Other potential considerations for the patient/family include genetic testing for early onset familial AD (I would recommend genetic counseling before doing so) and participating in research trials.

## 2017-04-17 ENCOUNTER — Ambulatory Visit (INDEPENDENT_AMBULATORY_CARE_PROVIDER_SITE_OTHER): Payer: BLUE CROSS/BLUE SHIELD | Admitting: Neurology

## 2017-04-17 ENCOUNTER — Encounter: Payer: Self-pay | Admitting: Neurology

## 2017-04-17 VITALS — BP 140/92 | HR 85 | Ht 64.0 in | Wt 128.4 lb

## 2017-04-17 DIAGNOSIS — F0280 Dementia in other diseases classified elsewhere without behavioral disturbance: Secondary | ICD-10-CM

## 2017-04-17 DIAGNOSIS — G300 Alzheimer's disease with early onset: Secondary | ICD-10-CM

## 2017-04-17 MED ORDER — MEMANTINE HCL-DONEPEZIL HCL ER 28-10 MG PO CP24: 1.0000 | ORAL_CAPSULE | Freq: Every day | ORAL | 3 refills | Status: DC

## 2017-04-17 MED ORDER — MEMANTINE HCL-DONEPEZIL HCL 7 & 14 & 21 &28 -10 MG PO C4PK: 1.0000 | EXTENDED_RELEASE_CAPSULE | ORAL | 0 refills | Status: DC

## 2017-04-17 NOTE — Progress Notes (Signed)
NEUROLOGY FOLLOW UP OFFICE NOTE  Sharon Evans [id number]  HISTORY OF PRESENT ILLNESS: Sharon Evans is a 57 year old right-handed woman with history of hypertension, hypercholesterolemia, and history of basal cell carcinoma on chest who follows up for memory problems.  She is accompanied by her husband who supplements history.    UPDATE: She is currently taking Aricept 10mg  daily. She underwent workup for memory deficits. MRI of brain with and without contrast from 02/19/17 was personally reviewed and revealed mild cortical volume loss along the convexities.   She underwent neuropsychological testing on 03/23/17, which demonstrated severe and global cognitive impairment. At this time, they are working on Science writer, POA for both health care and finances.  They are also looking into hiring a part-time companion who is experienced in working with clients with dementia.   HISTORY: She has been concerned about her memory since late 2017.  Initially, she would at times forget her purse when leaving the house.  Other times, she would stop mid-sentence because she can't get the word out (not word-finding difficulty).  She appeared anxious and tremulous, so she was started on an SSRI for anxiety.  Her anxiety improved and she was no longer stuttering to find words.  She does report continued stress, however.  More recently, her husband endorsed concerns about her memory.  On a couple of occasions, she heated food in a ceramic bowl on the stove and burnt the bowl.  She has struggled trying to open her locked car for a couple of minutes before her husband had to remind her that she forgot the keys.  She is having increased trouble remembering where common items are in the house.  She had missed paying her credit card balance, which is new.  She still drives without difficulty.  Her husband has not been concerned riding in the passenger seat with her.  However, one time she was driving in the car  with her son when she missed a turn and had to drive the wrong way on a street until she was able to maneuver and turn around.  This upset her son.  She has been losing weight unintentionally over the past 2 years.  She is more tremulous and is having trouble signing her name.  She denies headaches, visual disturbance, gait disturbance or falls.   She graduated college with a Water quality scientist.  She used to work in Nurse, children's.  She drinks maybe one beer a week.  She does not use illicit drugs.  There is no known family history of dementia or other neurodegenerative disease.   Labs include TSH 2.29, B12 311.  PAST MEDICAL HISTORY: History reviewed. No pertinent past medical history.  MEDICATIONS: Current Outpatient Medications on File Prior to Visit  Medication Sig Dispense Refill  . alendronate (FOSAMAX) 70 MG tablet Take 70 mg by mouth once a week.  11  . diazepam (VALIUM) 5 MG tablet Take 15 to 30 minute prior to MRI 1 tablet 0  . escitalopram (LEXAPRO) 10 MG tablet Take 10 mg by mouth daily.     No current facility-administered medications on file prior to visit.     ALLERGIES: Not on File  FAMILY HISTORY: Family History  Problem Relation Age of Onset  . Heart disease Mother   . Heart attack Father   . Dementia Neg Hx     SOCIAL HISTORY: Social History   Socioeconomic History  . Marital status: Married    Spouse name: [name]  .  Number of children: 2  . Years of education: BS  . Highest education level: Not on file  Social Needs  . Financial resource strain: Not on file  . Food insecurity - worry: Not on file  . Food insecurity - inability: Not on file  . Transportation needs - medical: Not on file  . Transportation needs - non-medical: Not on file  Occupational History  . Not on file  Tobacco Use  . Smoking status: Never Smoker  . Smokeless tobacco: Never Used  Substance and Sexual Activity  . Alcohol use: Yes    Alcohol/week: 1.2 oz    Types: 1 Glasses of wine, 1 Cans  of beer per week    Comment: 1-2 on week ends  . Drug use: No  . Sexual activity: Not on file  Other Topics Concern  . Not on file  Social History Narrative   Married ,lives with husband, two sons. Was previously in advertising.    REVIEW OF SYSTEMS: Constitutional: No fevers, chills, or sweats, no generalized fatigue, change in appetite Eyes: No visual changes, double vision, eye pain Ear, nose and throat: No hearing loss, ear pain, nasal congestion, sore throat Cardiovascular: No chest pain, palpitations Respiratory:  No shortness of breath at rest or with exertion, wheezes GastrointestinaI: No nausea, vomiting, diarrhea, abdominal pain, fecal incontinence Genitourinary:  No dysuria, urinary retention or frequency Musculoskeletal:  No neck pain, back pain Integumentary: No rash, pruritus, skin lesions Neurological: as above Psychiatric: No depression, insomnia, anxiety Endocrine: No palpitations, fatigue, diaphoresis, mood swings, change in appetite, change in weight, increased thirst Hematologic/Lymphatic:  No purpura, petechiae. Allergic/Immunologic: no itchy/runny eyes, nasal congestion, recent allergic reactions, rashes  PHYSICAL EXAM: Vitals:   04/17/17 1103  BP: (!) 140/92  Pulse: 85  SpO2: 98%   General: No acute distress.  Patient appears well-groomed.  normal body habitus. Head:  Normocephalic/atraumatic Eyes:  Fundi examined but not visualized Neck: supple, no paraspinal tenderness, full range of motion Heart:  Regular rate and rhythm Lungs:  Clear to auscultation bilaterally Back: No paraspinal tenderness Neurological Exam: Mental status:  alert and oriented to person, place, and time, delayed recall poor, remote memory intact, fund of knowledge intact, Difficulty following commands.  Cranial Nerve:  CN II-XII intact.  Bulk & Tone: normal, no fasciculations.  Motor:  5/5 throughout.  Sensation: light touch sensation intact  Deep Tendon Reflexes:  2+ throughout,  toes downgoing. 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  . Romberg negative.   IMPRESSION: Early-onset Alzheimer's disease  PLAN: 1.  We will add memantine to donepezil.  She will discontinue donepezil and start Namzaric starter pack to goal of 28mg /10mg  daily. 2.  Continue estate planning/POA 3.  Encouraged to hire companion 4.  Exercise, social/mentally stimulating activities 5.  No driving or working.  She is looking into disability. 6.  We did discuss possibility of genetic testing. 7.  Follow up in 6 months.  25 minutes spent face to face with patient, over 50% spent discussing diagnosis and managment.  Metta Clines, DO  CC:  Gaynelle Arabian, MD

## 2017-04-17 NOTE — Patient Instructions (Signed)
1. Stop donepezil.  Instead, we will start Namzaric (which is donepezil plus memantine, another memory medication).  Take the starter pack.  When you finish, start the Namzaric prescription (28mg /10mg  daily). 2.  Review material provided by Dr. Si Raider 3.  Follow up in 6 months.

## 2017-04-18 ENCOUNTER — Telehealth: Payer: Self-pay | Admitting: Neurology

## 2017-04-18 NOTE — Telephone Encounter (Signed)
Pt's spouse called in regards to pt's prescription for alzheimers

## 2017-04-19 NOTE — Telephone Encounter (Signed)
Called and spoke with Sharon Evans. The Namzaric started pack is $130, he wanted to know if we had heard from the rep about samples. I advsd him I have left a message and I will call again to check status. He said he will have Sharon Evans continue regular meds until he hears from [place] about samples.

## 2017-04-19 NOTE — Telephone Encounter (Signed)
Called to advise [employer] rep will be in the office tomorrow, LM on VM as he advsd me to do.

## 2017-04-20 NOTE — Telephone Encounter (Signed)
Called Bill to advise the Rep came and left sample, he will come by [facility] or Tues to p/u sample. Samples at front desk

## 2017-04-24 ENCOUNTER — Encounter: Payer: BLUE CROSS/BLUE SHIELD | Admitting: Psychology

## 2017-05-15 ENCOUNTER — Ambulatory Visit: Payer: Managed Care, Other (non HMO) | Admitting: Neurology

## 2017-05-16 ENCOUNTER — Telehealth: Payer: Self-pay | Admitting: Neurology

## 2017-05-16 MED ORDER — MEMANTINE HCL-DONEPEZIL HCL ER 28-10 MG PO CP24: 1.0000 | ORAL_CAPSULE | Freq: Every day | ORAL | 3 refills | Status: DC

## 2017-05-16 NOTE — Telephone Encounter (Signed)
Called and spoke with Sharon Evans, Pt is on the last of the starter pack, of namzaric, she is tolerating well, needs Rx called in. R x was alreaday sent in 04/17/17

## 2017-05-16 NOTE — Telephone Encounter (Signed)
Patient's husband Lmom that the [facility] was [facility] at [facility] and can you please call [facility] back? Thanks

## 2017-05-16 NOTE — Telephone Encounter (Signed)
Pt's spouse called and left a message about pt's alzheimer's medication and would like a call back

## 2017-06-06 DIAGNOSIS — Z01419 Encounter for gynecological examination (general) (routine) without abnormal findings: Secondary | ICD-10-CM | POA: Diagnosis not present

## 2017-06-06 DIAGNOSIS — Z1231 Encounter for screening mammogram for malignant neoplasm of breast: Secondary | ICD-10-CM | POA: Diagnosis not present

## 2017-06-06 DIAGNOSIS — Z6822 Body mass index (BMI) 22.0-22.9, adult: Secondary | ICD-10-CM | POA: Diagnosis not present

## 2017-06-11 DIAGNOSIS — I10 Essential (primary) hypertension: Secondary | ICD-10-CM | POA: Diagnosis not present

## 2017-06-11 DIAGNOSIS — F411 Generalized anxiety disorder: Secondary | ICD-10-CM | POA: Diagnosis not present

## 2017-06-11 DIAGNOSIS — Z0000 Encounter for general adult medical examination without abnormal findings: Secondary | ICD-10-CM | POA: Diagnosis not present

## 2017-06-11 DIAGNOSIS — E7800 Pure hypercholesterolemia, unspecified: Secondary | ICD-10-CM | POA: Diagnosis not present

## 2017-06-22 ENCOUNTER — Other Ambulatory Visit: Payer: Self-pay | Admitting: Neurology

## 2017-07-17 ENCOUNTER — Encounter: Payer: BLUE CROSS/BLUE SHIELD | Admitting: Psychology

## 2017-07-26 ENCOUNTER — Encounter: Payer: BLUE CROSS/BLUE SHIELD | Admitting: Psychology

## 2017-08-06 ENCOUNTER — Encounter: Payer: Self-pay | Admitting: Neurology

## 2017-09-07 DIAGNOSIS — L259 Unspecified contact dermatitis, unspecified cause: Secondary | ICD-10-CM | POA: Diagnosis not present

## 2017-10-12 ENCOUNTER — Other Ambulatory Visit: Payer: Self-pay | Admitting: Neurology

## 2017-10-15 ENCOUNTER — Encounter: Payer: Self-pay | Admitting: Neurology

## 2017-10-15 ENCOUNTER — Ambulatory Visit (INDEPENDENT_AMBULATORY_CARE_PROVIDER_SITE_OTHER): Payer: BLUE CROSS/BLUE SHIELD | Admitting: Neurology

## 2017-10-15 VITALS — BP 128/72 | HR 71 | Ht 64.0 in | Wt 133.0 lb

## 2017-10-15 DIAGNOSIS — F0280 Dementia in other diseases classified elsewhere without behavioral disturbance: Secondary | ICD-10-CM

## 2017-10-15 DIAGNOSIS — G300 Alzheimer's disease with early onset: Secondary | ICD-10-CM | POA: Diagnosis not present

## 2017-10-15 NOTE — Progress Notes (Signed)
NEUROLOGY FOLLOW UP OFFICE NOTE  Sharon Evans [id number]  HISTORY OF PRESENT ILLNESS: Sharon Evans is a 57 year old right-handed woman with history of hypertension, hypercholesterolemia, and history of basal cell carcinoma on chest who follows up for memory problems.  She is accompanied by her husband who supplements history.     UPDATE: She is currently taking Namzaric. At this time, they are working on Science writer.  POA for both health care and finances has been established.  They are just making small changes to wills.  She has a part-time companion that spends time with her and will drive her to places she needs to be.  Often, they will just go out for lunch and talk.  She goes out almost everyday.  She no longer drives.  She is able to manage ADLs such as personal hygiene and dressing.  While she is able to use utensils without difficulty, she does exhibit some apraxia.  When she eats a tortilla chip with salsa, she will often spill it on herself or if she eats a muffin, it will often break into crumbs.  She feels safe at home and does not get lost in the house.  There has been no change in memory.  She still remembers names and faces.  Mood is good.  Appetite is good.  She sleeps well.   HISTORY: She has been concerned about her memory since late 2017.  Initially, she would at times forget her purse when leaving the house.  Other times, she would stop mid-sentence because she can't get the word out (not word-finding difficulty).  She appeared anxious and tremulous, so she was started on an SSRI for anxiety.  Her anxiety improved and she was no longer stuttering to find words.  She does report continued stress, however.  More recently, her husband endorsed concerns about her memory.  On a couple of occasions, she heated food in a ceramic bowl on the stove and burnt the bowl.  She has struggled trying to open her locked car for a couple of minutes before her husband had to remind her  that she forgot the keys.  She is having increased trouble remembering where common items are in the house.  She had missed paying her credit card balance, which is new.  She still drives without difficulty.  Her husband has not been concerned riding in the passenger seat with her.  However, one time she was driving in the car with her son when she missed a turn and had to drive the wrong way on a street until she was able to maneuver and turn around.  This upset her son.  She is more tremulous and is having trouble signing her name.  She denies headaches, visual disturbance, gait disturbance or falls.  MRI of brain with and without contrast from 02/19/17 was personally reviewed and revealed mild cortical volume loss along the convexities.  She underwent neuropsychological testing on 03/23/17, which demonstrated severe and global cognitive impairment, likely consistent with early-onset Alzheimer's disease.   She graduated college with a Water quality scientist.  She used to work in Nurse, children's.  She drinks maybe one beer a week.  She does not use illicit drugs.  There is no known family history of dementia or other neurodegenerative disease.   Labs include TSH 2.29, B12 311.  PAST MEDICAL HISTORY: No past medical history on file.  MEDICATIONS: Current Outpatient Medications on File Prior to Visit  Medication Sig Dispense Refill  .  alendronate (FOSAMAX) 70 MG tablet Take 70 mg by mouth once a week.  11  . diazepam (VALIUM) 5 MG tablet Take 15 to 30 minute prior to MRI (Patient not taking: Reported on 10/15/2017) 1 tablet 0  . escitalopram (LEXAPRO) 10 MG tablet Take 10 mg by mouth daily.    . Memantine HCl-Donepezil HCl (NAMZARIC) 28-10 MG CP24 Take 1 capsule by mouth daily. 30 capsule 3  . Memantine HCl-Donepezil HCl (NAMZARIC) 7 & 14 & 21 &28 -10 MG C4PK Take 1 capsule by mouth as directed. (Patient not taking: Reported on 10/15/2017) 1 each 0  . NAMZARIC 28-10 MG CP24 TAKE 1 CAPSULE BY MOUTH EVERY DAY (Patient  not taking: Reported on 10/15/2017) 90 capsule 1   No current facility-administered medications on file prior to visit.     ALLERGIES: Not on File  FAMILY HISTORY: Family History  Problem Relation Age of Onset  . Heart disease Mother   . Heart attack Father   . Dementia Neg Hx     SOCIAL HISTORY: Social History   Socioeconomic History  . Marital status: Married    Spouse name: [name]  . Number of children: 2  . Years of education: BS  . Highest education level: Not on file  Occupational History  . Not on file  Social Needs  . Financial resource strain: Not on file  . Food insecurity:    Worry: Not on file    Inability: Not on file  . Transportation needs:    Medical: Not on file    Non-medical: Not on file  Tobacco Use  . Smoking status: Never Smoker  . Smokeless tobacco: Never Used  Substance and Sexual Activity  . Alcohol use: Yes    Alcohol/week: 1.2 oz    Types: 1 Glasses of wine, 1 Cans of beer per week    Comment: 1-2 on week ends  . Drug use: No  . Sexual activity: Not on file  Lifestyle  . Physical activity:    Days per week: Not on file    Minutes per session: Not on file  . Stress: Not on file  Relationships  . Social connections:    Talks on phone: Not on file    Gets together: Not on file    Attends religious service: Not on file    Active member of club or organization: Not on file    Attends meetings of clubs or organizations: Not on file    Relationship status: Not on file  . Intimate partner violence:    Fear of current or ex partner: Not on file    Emotionally abused: Not on file    Physically abused: Not on file    Forced sexual activity: Not on file  Other Topics Concern  . Not on file  Social History Narrative   Married ,lives with husband, two sons. Was previously in advertising.    REVIEW OF SYSTEMS: Constitutional: No fevers, chills, or sweats, no generalized fatigue, change in appetite Eyes: No visual changes, double vision,  eye pain Ear, nose and throat: No hearing loss, ear pain, nasal congestion, sore throat Cardiovascular: No chest pain, palpitations Respiratory:  No shortness of breath at rest or with exertion, wheezes GastrointestinaI: No nausea, vomiting, diarrhea, abdominal pain, fecal incontinence Genitourinary:  No dysuria, urinary retention or frequency Musculoskeletal:  No neck pain, back pain Integumentary: No rash, pruritus, skin lesions Neurological: as above Psychiatric: No depression, insomnia, anxiety Endocrine: No palpitations, fatigue, diaphoresis, mood swings, change  in appetite, change in weight, increased thirst Hematologic/Lymphatic:  No purpura, petechiae. Allergic/Immunologic: no itchy/runny eyes, nasal congestion, recent allergic reactions, rashes  PHYSICAL EXAM: Vitals:   10/15/17 0935  BP: 128/72  Pulse: 71  SpO2: 98%   General: No acute distress.  Patient appears well-groomed.  normal body habitus. Head:  Normocephalic/atraumatic Eyes:  Fundi examined but not visualized Neck: supple, no paraspinal tenderness, full range of motion Heart:  Regular rate and rhythm Lungs:  Clear to auscultation bilaterally Back: No paraspinal tenderness Neurological Exam: Mental status:  alert and oriented to person, place, and time (except year), delayed recall poor, remote memory intact, fund of knowledge intact, Difficulty following commands.  Cranial Nerve:  CN II-XII intact.  Bulk & Tone: normal, no fasciculations.  Motor:  5/5 throughout.  Sensation: light touch sensation intact  Deep Tendon Reflexes:  2+ throughout, toes downgoing. 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  . Romberg negative.  IMPRESSION: Early-onset Alzheimer's disease  PLAN: 1.  Continue Namzaric 28mg /10mg  daily 2.  Continue/encourage social interaction and activities, exercise 3.   Discussed option for family genetic testing available. 4.  Follow up in 6 months.  25 minutes spent face to face with patient, over 50% spent discussing diagnosis and management.  Metta Clines, DO  CC:  Gaynelle Arabian, MD

## 2017-10-15 NOTE — Patient Instructions (Signed)
1.  Continue Namzaric daily 2.  Follow up in 6 months. We will repeat testing at that time.   Alzheimer Disease Caregiver Guide A person who has Alzheimer disease may not be able to take care of himself or herself. He or she may need help with simple tasks. The tips below can help you care for the person. Memory loss and confusion If the person is confused or cannot remember things:  Stay calm.  Respond with a short answer.  Avoid correcting him or her in a way that sounds like scolding.  Try not to take it personally, even if he or she forgets your name.  Behavior changes The person may go through behavior changes. This can include depression, anxiety, anger, or seeing things that are not there. When behavior changes:  Try not to take behavior changes personally.  Stay calm and patient.  Do not argue or try to convince the person about a specific point.  Know that these changes are part of the disease process. Try to work through it.  Tips to lessen frustration  Make appointments and do daily tasks when the person is at his or her best.  Take your time. Simple tasks may take longer. Allow plenty of time to complete tasks.  Limit choices for the person.  Involve the person in what you are doing.  Stick to a routine.  Avoid new or crowded places, if possible.  Use simple words, short sentences, and a calm voice. Only give 1 direction at a time.  Buy clothes and shoes that are easy to put on and take off.  Let people help if they offer. Home safety  Keep floors clear. Remove rugs, magazine racks, and floor lamps.  Keep hallways well lit.  Put a handrail and nonslip mat in the bathtub or shower.  Put childproof locks on cabinets that have dangerous items in them. These items include medicine, alcohol, guns, toxic cleaning items, sharp tools, matches, or lighters.  Place locks on doors where the person cannot see or reach them. This helps the person to not wander  out of the house and get lost.  Be prepared for emergencies. Keep a list of emergency phone numbers and addresses in a handy area. Plans for the future  Talk about finances. ? Talk about money management. People with Alzheimer disease have trouble managing their money as the disease gets worse. ? Get help from professional advisors about financial and legal matters.  Talk about future care. ? Choose a power of attorney. This is someone who can make decisions for the person with Alzheimer disease when he or she can no longer do so. ? Talk about driving and when it is the right time to stop. The person's doctor can help with this. ? If the person lives alone, make sure he or she is safe. Some people need extra help at home. Other people need more care at a nursing home or care center. Support groups Some benefits of joining a support group include:  Learning ways to manage stress.  Sharing experiences with others.  Getting emotional comfort and support.  Learning new caregiving skills as the disease progresses.  Knowing what community resources are available and taking advantage of them.  Get help if:  The person has a fever.  The person has a sudden behavior change that does not get better with calming strategies.  The person is unable to manage his or her living situation.  The person threatens you or  anyone else, including himself or herself.  You are no longer able to care for the person. This information is not intended to replace advice given to you by your health care provider. Make sure you discuss any questions you have with your health care provider. Document Released: 06/19/2011 Document Revised: 09/02/2015 Document Reviewed: 05/17/2011 Elsevier Interactive Patient Education  2017 [employer].

## 2017-10-19 DIAGNOSIS — L259 Unspecified contact dermatitis, unspecified cause: Secondary | ICD-10-CM | POA: Diagnosis not present

## 2018-04-01 ENCOUNTER — Other Ambulatory Visit: Payer: Self-pay | Admitting: Neurology

## 2018-04-17 ENCOUNTER — Ambulatory Visit: Payer: BLUE CROSS/BLUE SHIELD | Admitting: Neurology

## 2018-05-27 NOTE — Progress Notes (Signed)
NEUROLOGY FOLLOW UP OFFICE NOTE  Sharon Evans [id number]  HISTORY OF PRESENT ILLNESS: Sharon Evans is a 58 year old right-handed Caucasian woman with hypertension, hypercholesterolemia, and history of basal cell carcinoma on chest who follows up for early onset Alzheimer's dementia.  She is accompanied by her husband who supplements history.  UPDATE: Current medication:  Namzaric  She has a part-time companion that spends time with her and will drive her to places she needs to be.  Often, they will just go out for lunch and talk.  She goes out almost everyday.  She no longer drives.  She is able to manage ADLs such as personal hygiene and dressing.  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   There has been no change in memory.  She still remembers names and faces.  Mood is good.  Appetite is good.    Over the holidays, her glasses broke.  She got new bifocals.  If she is reading on the phone, the words sometimes appear jumbled on the screen.  Other times, she can read text messages.  If she reads on a page, such as a magazine, she has no difficulty.   She sleeps well.  Will's have not yet been updated.  Still in process.    HISTORY: She has been concerned about her memory since late 2017.  Initially, she would at times forget her purse when leaving the house.  Other times, she would stop mid-sentence because she can't get the word out (not word-finding difficulty).  She appeared anxious and tremulous, so she was started on an SSRI for anxiety.  Her anxiety improved and she was no longer stuttering to find words.  She does report continued stress, however.  More recently, her husband endorsed concerns about her memory.  On a couple of occasions, she heated food in a ceramic bowl on the stove and burnt the bowl.  She has struggled trying to open her locked car for a couple of minutes before her husband had to remind her that she forgot the keys.  She is having increased trouble remembering where  common items are in the house.  She had missed paying her credit card balance, which is new.  She still drives without difficulty.  Her husband has not been concerned riding in the passenger seat with her.  However, one time she was driving in the car with her son when she missed a turn and had to drive the wrong way on a street until she was able to maneuver and turn around.  This upset her son.  She is more tremulous and is having trouble signing her name.  She denies headaches, visual disturbance, gait disturbance or falls.  MRI of brain with and without contrast from 02/19/17 was personally reviewed and revealed mild cortical volume loss along the convexities.  She underwent neuropsychological testing on 03/23/17, which demonstrated severe and global cognitive impairment, likely consistent with early-onset Alzheimer's disease.  She graduated college with a Water quality scientist.  She used to work in Nurse, children's.  She drinks maybe one beer a week.  She does not use illicit drugs.  There is no known family history of dementia or other neurodegenerative disease.  Labs include TSH 2.29, B12 311.  Her husband is POA for both health care and finances.  PAST MEDICAL HISTORY: No past medical history on file.  MEDICATIONS: Current Outpatient Medications on File Prior to Visit  Medication Sig Dispense Refill  . alendronate (FOSAMAX) 70 MG tablet Take 70 mg by mouth  once a week.  11  . diazepam (VALIUM) 5 MG tablet Take 15 to 30 minute prior to MRI (Patient not taking: Reported on 10/15/2017) 1 tablet 0  . escitalopram (LEXAPRO) 10 MG tablet Take 10 mg by mouth daily.    . Memantine HCl-Donepezil HCl (NAMZARIC) 28-10 MG CP24 Take 1 capsule by mouth daily. 30 capsule 3  . Memantine HCl-Donepezil HCl (NAMZARIC) 7 & 14 & 21 &28 -10 MG C4PK Take 1 capsule by mouth as directed. (Patient not taking: Reported on 10/15/2017) 1 each 0  . NAMZARIC 28-10 MG CP24 TAKE 1 CAPSULE BY MOUTH EVERY DAY 90 capsule 1   No current  facility-administered medications on file prior to visit.     ALLERGIES: Not on File  FAMILY HISTORY: Family History  Problem Relation Age of Onset  . Heart disease Mother   . Heart attack Father   . Dementia Neg Hx    SOCIAL HISTORY: Social History   Socioeconomic History  . Marital status: Married    Spouse name: [name]  . Number of children: 2  . Years of education: BS  . Highest education level: Not on file  Occupational History  . Not on file  Social Needs  . Financial resource strain: Not on file  . Food insecurity:    Worry: Not on file    Inability: Not on file  . Transportation needs:    Medical: Not on file    Non-medical: Not on file  Tobacco Use  . Smoking status: Never Smoker  . Smokeless tobacco: Never Used  Substance and Sexual Activity  . Alcohol use: Yes    Alcohol/week: 2.0 standard drinks    Types: 1 Glasses of wine, 1 Cans of beer per week    Comment: 1-2 on week ends  . Drug use: No  . Sexual activity: Not on file  Lifestyle  . Physical activity:    Days per week: Not on file    Minutes per session: Not on file  . Stress: Not on file  Relationships  . Social connections:    Talks on phone: Not on file    Gets together: Not on file    Attends religious service: Not on file    Active member of club or organization: Not on file    Attends meetings of clubs or organizations: Not on file    Relationship status: Not on file  . Intimate partner violence:    Fear of current or ex partner: Not on file    Emotionally abused: Not on file    Physically abused: Not on file    Forced sexual activity: Not on file  Other Topics Concern  . Not on file  Social History Narrative   Married ,lives with husband, two sons. Was previously in advertising.    REVIEW OF SYSTEMS: Constitutional: No fevers, chills, or sweats, no generalized fatigue, change in appetite Eyes: No visual changes, double vision, eye pain Ear, nose and throat: No hearing loss, ear  pain, nasal congestion, sore throat Cardiovascular: No chest pain, palpitations Respiratory:  No shortness of breath at rest or with exertion, wheezes GastrointestinaI: No nausea, vomiting, diarrhea, abdominal pain, fecal incontinence Genitourinary:  No dysuria, urinary retention or frequency Musculoskeletal:  No neck pain, back pain Integumentary: No rash, pruritus, skin lesions Neurological: as above Psychiatric: No depression, insomnia, anxiety Endocrine: No palpitations, fatigue, diaphoresis, mood swings, change in appetite, change in weight, increased thirst Hematologic/Lymphatic:  No purpura, petechiae. Allergic/Immunologic: no itchy/runny eyes,  nasal congestion, recent allergic reactions, rashes  PHYSICAL EXAM: Blood pressure 110/70, pulse 88, height 5\' 4"  (1.626 m), weight 146 lb (66.2 kg), SpO2 98 %. General: No acute distress.  Patient appears well-groomed.   Head:  Normocephalic/atraumatic Eyes:  Fundi examined but not visualized Neck: supple, no paraspinal tenderness, full range of motion Heart:  Regular rate and rhythm Lungs:  Clear to auscultation bilaterally Back: No paraspinal tenderness Neurological Exam: Mental status: Alert and oriented to person, month, day, state, town and building.  Delayed recall poor, remote memory intact.  Fund of knowledge intact.  Difficulty following commands/apraxia.  Unable to write a complete sentence.  Unable to copy intersecting cube.  MMSE - Mini Mental State Exam 05/28/2018  Orientation to time 2  Orientation to Place 3  Registration 3  Attention/ Calculation 0  Recall 1  Language- name 2 objects 1  Language- repeat 1  Language- follow 3 step command 0  Language- read & follow direction 1  Write a sentence 0  Copy design 0  Total score 12   Cranial nerves: CN II-XII intact.  Bulk and tone: Normal, no fasciculations.  Motor: Muscle strength 5/5 throughout.  Sensation: Light touch sensation intact.  Deep tendon reflexes: 2+  throughout, toes downgoing.  Finger-to-nose testing: Unable to assess as patient has difficulty following directions.  Gait: Normal station and stride.  Romberg negative.  IMPRESSION: Early onset Alzheimer's disease.  She is exhibiting difficu  PLAN: 1.  Continue Namzaric 28 mg / 10 mg daily 2.  Continue and encourage social interaction and activities 3.  Follow up in 6 months.  25 minutes spent face to face with patient, over 50% spent discussing management.  Metta Clines, DO  CC: Gaynelle Arabian, MD

## 2018-05-28 ENCOUNTER — Encounter: Payer: Self-pay | Admitting: Neurology

## 2018-05-28 ENCOUNTER — Ambulatory Visit (INDEPENDENT_AMBULATORY_CARE_PROVIDER_SITE_OTHER): Payer: BLUE CROSS/BLUE SHIELD | Admitting: Neurology

## 2018-05-28 VITALS — BP 110/70 | HR 88 | Ht 64.0 in | Wt 146.0 lb

## 2018-05-28 DIAGNOSIS — G300 Alzheimer's disease with early onset: Secondary | ICD-10-CM

## 2018-05-28 DIAGNOSIS — F0280 Dementia in other diseases classified elsewhere without behavioral disturbance: Secondary | ICD-10-CM | POA: Diagnosis not present

## 2018-05-28 NOTE — Patient Instructions (Addendum)
1.  Continue Namzaric 2.  Continue following up with friends and family 3.  Continue exercise 4.  Follow up in 6 months

## 2018-06-07 DIAGNOSIS — Z1231 Encounter for screening mammogram for malignant neoplasm of breast: Secondary | ICD-10-CM | POA: Diagnosis not present

## 2018-06-07 DIAGNOSIS — Z6824 Body mass index (BMI) 24.0-24.9, adult: Secondary | ICD-10-CM | POA: Diagnosis not present

## 2018-06-07 DIAGNOSIS — Z13820 Encounter for screening for osteoporosis: Secondary | ICD-10-CM | POA: Diagnosis not present

## 2018-06-07 DIAGNOSIS — Z01419 Encounter for gynecological examination (general) (routine) without abnormal findings: Secondary | ICD-10-CM | POA: Diagnosis not present

## 2018-09-28 ENCOUNTER — Other Ambulatory Visit: Payer: Self-pay | Admitting: Neurology

## 2018-11-22 DIAGNOSIS — Z0000 Encounter for general adult medical examination without abnormal findings: Secondary | ICD-10-CM | POA: Diagnosis not present

## 2018-12-10 ENCOUNTER — Telehealth: Payer: Self-pay | Admitting: Neurology

## 2018-12-10 DIAGNOSIS — H52203 Unspecified astigmatism, bilateral: Secondary | ICD-10-CM | POA: Diagnosis not present

## 2018-12-10 DIAGNOSIS — H2513 Age-related nuclear cataract, bilateral: Secondary | ICD-10-CM | POA: Diagnosis not present

## 2018-12-10 DIAGNOSIS — H524 Presbyopia: Secondary | ICD-10-CM | POA: Diagnosis not present

## 2018-12-10 NOTE — Telephone Encounter (Signed)
He can talk with me while she is performing the memory testing or he may write down his concerns for me to read when they arrive.

## 2018-12-10 NOTE — Telephone Encounter (Signed)
Please advise if this is okay?

## 2018-12-10 NOTE — Telephone Encounter (Signed)
No asnwer at [phone]

## 2018-12-10 NOTE — Telephone Encounter (Signed)
Caller says his wife has an appointment on Thursday at 8:50. He wants to know if he can have a private conversation with the doctor about his wife when he is here. She has Alzheimer's and her condition has declined.

## 2018-12-11 NOTE — Progress Notes (Signed)
NEUROLOGY FOLLOW UP OFFICE NOTE  Sharon Evans [id number]  HISTORY OF PRESENT ILLNESS: Sharon Evans is a 58 year old right-handed Caucasian woman with hypertension, hypercholesterolemia, and history of basal cell carcinoma on chest who follows up for early onset Alzheimer's dementia.  She is accompanied by her husband who supplements history.  UPDATE: Current medication:  Namzaric  She has a part-time companion that spends time with her and will drive her to places she needs to be. 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  She is still able to use the toilet but requires assistance with bathing/hygiene, dressing, and eating.  Her memory has been fairly good.  She recognizes all familiar faces.  She is oriented.  She reportedly does not frequently repeat herself.  She does not have any change in personality or inappropriate behavior.  She sleeps well.  She denies hallucinations.  Since onset of the COVID pandemic, her husband has been working from home and has been able to be with her.   HISTORY: She has been concerned about her memory since late 2017. Initially, she would at times forget her purse when leaving the house. Other times, she would stop mid-sentence because she can't get the word out (not word-finding difficulty). She appeared anxious and tremulous, so she was started on an SSRI for anxiety. Her anxiety improved and she was no longer stuttering to find words. She does report continued stress, however. More recently, her husband endorsed concerns about her memory. On a couple of occasions, she heated food in a ceramic bowl on the stove and burnt the bowl. She has struggled trying to open her locked car for a couple of minutes before her husband had to remind her that she forgot the keys. She is having increased trouble remembering where common items are in the house. She had missed paying her credit card balance, which is new. She still drives without difficulty. Her  husband has not been concerned riding in the passenger seat with her. However, one time she was driving in the car with her son when she missed a turn and had to drive the wrong way on a street until she was able to maneuver and turn around. This upset her son. She is more tremulous and is having trouble signing her name. She denies headaches, visual disturbance, gait disturbance or falls.  MRI of brain with and without contrast from 02/19/17 was personally reviewed and revealed mild cortical volume loss along the convexities. She underwent neuropsychological testing on 03/23/17, which demonstrated severe and global cognitive impairment, likely consistent with early-onset Alzheimer's disease.  She graduated college with a Water quality scientist. She used to work in Nurse, children's. She drinks maybe one beer a week. She does not use illicit drugs. There is no known family history of dementia or other neurodegenerative disease.  Labs include TSH 2.29, B12 311.  Her husband is POA for both health care and finances.  PAST MEDICAL HISTORY: No past medical history on file.   MEDICATIONS: Current Outpatient Medications on File Prior to Visit  Medication Sig Dispense Refill  . alendronate (FOSAMAX) 70 MG tablet Take 70 mg by mouth once a week.  11  . diazepam (VALIUM) 5 MG tablet Take 15 to 30 minute prior to MRI 1 tablet 0  . escitalopram (LEXAPRO) 10 MG tablet Take 10 mg by mouth daily.    . Memantine HCl-Donepezil HCl (NAMZARIC) 28-10 MG CP24 Take 1 capsule by mouth daily. 30 capsule 3  . Memantine HCl-Donepezil HCl [facility])  7 & 14 & 21 &28 -10 MG C4PK Take 1 capsule by mouth as directed. 1 each 0  . NAMZARIC 28-10 MG CP24 TAKE 1 CAPSULE BY MOUTH EVERY DAY 90 capsule 1   No current facility-administered medications on file prior to visit.     ALLERGIES: No Known Allergies  FAMILY HISTORY: Family History  Problem Relation Age of Onset  . Heart disease Mother   . Heart attack Father   .  Dementia Neg Hx     SOCIAL HISTORY: Social History   Socioeconomic History  . Marital status: Married    Spouse name: [name]  . Number of children: 2  . Years of education: BS  . Highest education level: Not on file  Occupational History  . Not on file  Social Needs  . Financial resource strain: Not on file  . Food insecurity    Worry: Not on file    Inability: Not on file  . Transportation needs    Medical: Not on file    Non-medical: Not on file  Tobacco Use  . Smoking status: Never Smoker  . Smokeless tobacco: Never Used  Substance and Sexual Activity  . Alcohol use: Yes    Alcohol/week: 2.0 standard drinks    Types: 1 Glasses of wine, 1 Cans of beer per week    Comment: 1-2 on week ends  . Drug use: No  . Sexual activity: Not on file  Lifestyle  . Physical activity    Days per week: Not on file    Minutes per session: Not on file  . Stress: Not on file  Relationships  . Social Herbalist on phone: Not on file    Gets together: Not on file    Attends religious service: Not on file    Active member of club or organization: Not on file    Attends meetings of clubs or organizations: Not on file    Relationship status: Not on file  . Intimate partner violence    Fear of current or ex partner: Not on file    Emotionally abused: Not on file    Physically abused: Not on file    Forced sexual activity: Not on file  Other Topics Concern  . Not on file  Social History Narrative   Married ,lives with husband, two sons. Was previously in advertising.    REVIEW OF SYSTEMS: Constitutional: No fevers, chills, or sweats, no generalized fatigue, change in appetite Eyes: No visual changes, double vision, eye pain Ear, nose and throat: No hearing loss, ear pain, nasal congestion, sore throat Cardiovascular: No chest pain, palpitations Respiratory:  No shortness of breath at rest or with exertion, wheezes GastrointestinaI: No nausea, vomiting, diarrhea, abdominal  pain, fecal incontinence Genitourinary:  No dysuria, urinary retention or frequency Musculoskeletal:  No neck pain, back pain Integumentary: No rash, pruritus, skin lesions Neurological: as above Psychiatric: No depression, insomnia, anxiety Endocrine: No palpitations, fatigue, diaphoresis, mood swings, change in appetite, change in weight, increased thirst Hematologic/Lymphatic:  No purpura, petechiae. Allergic/Immunologic: no itchy/runny eyes, nasal congestion, recent allergic reactions, rashes  PHYSICAL EXAM: Blood pressure (!) 154/78, pulse 66, temperature (!) 97.4 F (36.3 C), height 5\' 4"  (1.626 m), weight 144 lb (65.3 kg), SpO2 99 %. General: No acute distress.  Patient appears well-groomed.   Head:  Normocephalic/atraumatic Eyes:  Fundi examined but not visualized Neck: supple, no paraspinal tenderness, full range of motion Heart:  Regular rate and rhythm Lungs:  Clear to  auscultation bilaterally Back: No paraspinal tenderness Neurological Exam: alert and oriented to person, and place but for time only year.  Attention span and concentration impaired, recent memory poor, remote memory intact, fund of knowledge intact.  Speech fluent and not dysarthric.  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   MMSE - Mini Mental State Exam 12/12/2018 05/28/2018  Orientation to time 1 2  Orientation to Place 4 3  Registration 3 3  Attention/ Calculation 0 0  Recall 0 1  Language- name 2 objects 2 1  Language- repeat 1 1  Language- follow 3 step command 3 0  Language- read & follow direction 1 1  Write a sentence 0 0  Copy design 0 0  Total score 15 12   CN II-XII intact. Bulk and tone normal, muscle strength 5/5 throughout.  Sensation to light touch, temperature and vibration intact.  Deep tendon reflexes 2+ throughout, toes downgoing.  Finger to nose and heel to shin testing intact.  Gait normal, Romberg negative.  IMPRESSION: Early-onset Alzheimer's disease.  I still suspect  AD.  However, her memory deficits do not seem in proportion to her apraxia.  While she does have memory deficits, I would suspect it to be worse in comparison to her degree of apraxia.  Consider FTD, but she does not exhibit any change in behavior or personality.  She does not exhibit any hallucinations to suggest LBD.  PLAN: 1.  We will repeat neuropsychological testing 2.  Namzaric 3.  Follow up in 6 months or sooner pending test results.  25 minutes spent face to face with patient, over 50% spent discussing plan.   Metta Clines, DO  CC: Gaynelle Arabian, MD

## 2018-12-11 NOTE — Telephone Encounter (Signed)
Patient's husband called to check on the answer to his question. Please call him back at: [phone].

## 2018-12-11 NOTE — Telephone Encounter (Signed)
No answer again at 1255 12/11/2018

## 2018-12-12 ENCOUNTER — Ambulatory Visit (INDEPENDENT_AMBULATORY_CARE_PROVIDER_SITE_OTHER): Payer: BC Managed Care – PPO | Admitting: Neurology

## 2018-12-12 ENCOUNTER — Other Ambulatory Visit: Payer: Self-pay

## 2018-12-12 ENCOUNTER — Encounter: Payer: Self-pay | Admitting: Neurology

## 2018-12-12 VITALS — BP 154/78 | HR 66 | Temp 97.4°F | Ht 64.0 in | Wt 144.0 lb

## 2018-12-12 DIAGNOSIS — G300 Alzheimer's disease with early onset: Secondary | ICD-10-CM

## 2018-12-12 DIAGNOSIS — F0280 Dementia in other diseases classified elsewhere without behavioral disturbance: Secondary | ICD-10-CM

## 2018-12-12 NOTE — Patient Instructions (Signed)
1.  Continue Namzaric 2.  We will schedule another neuropsychological exam 3.  Follow up in 6 months (sooner if needed)

## 2019-01-21 ENCOUNTER — Telehealth: Payer: Self-pay | Admitting: Neurology

## 2019-01-21 NOTE — Telephone Encounter (Signed)
Patient's spouse, Annis Tetzlaff, called to check on the status of a requested letter for his work from Dr. Tomi Likens with an update on his wife's status.

## 2019-01-22 NOTE — Telephone Encounter (Signed)
Called patient's spouse and left a message to call back with more specific details for the letter requested.   FYI only: At the initial call when he was asked for details needed in the letter, he said he just needed a general status update on his wife, the patient.

## 2019-01-22 NOTE — Telephone Encounter (Signed)
Husband left msg returning your call

## 2019-01-22 NOTE — Telephone Encounter (Signed)
Left message for pt's husband to return call. Informed in message to let us know exactly what documentation he needs in the letter.

## 2019-01-22 NOTE — Telephone Encounter (Signed)
I need detail regarding what needs to be written and we can provide it.

## 2019-01-23 ENCOUNTER — Encounter: Payer: BC Managed Care – PPO | Admitting: Psychology

## 2019-01-23 NOTE — Telephone Encounter (Signed)
Called patient's spouse and left a message to call back with details needed in the requested letter.

## 2019-01-29 NOTE — Telephone Encounter (Signed)
We can state that patient is seen for dementia.  Last office visit was 12/12/2018.  Based on my assessment, she has difficulty performing common daily activities and will have difficulty learning new skills.  Her disease will progress over time.

## 2019-01-29 NOTE — Telephone Encounter (Signed)
See below for details needed for letter

## 2019-01-29 NOTE — Telephone Encounter (Signed)
Letter created Needs provider signature He is out of the office until tomorrow Will need to wait until then Spouse made aware of this.

## 2019-01-29 NOTE — Telephone Encounter (Signed)
Patient's spouse said the letter needs to state the doctor's observations from the patient's last visit as well as her prognosis. He said he spoke briefly with the doctor at the last visit about the letter.   The patient's husband will come and pick the letter up when it's ready.

## 2019-01-30 ENCOUNTER — Encounter: Payer: BC Managed Care – PPO | Admitting: Psychology

## 2019-03-18 DIAGNOSIS — N950 Postmenopausal bleeding: Secondary | ICD-10-CM | POA: Diagnosis not present

## 2019-03-18 DIAGNOSIS — N390 Urinary tract infection, site not specified: Secondary | ICD-10-CM | POA: Diagnosis not present

## 2019-03-28 ENCOUNTER — Other Ambulatory Visit: Payer: Self-pay | Admitting: Neurology

## 2019-03-28 NOTE — Telephone Encounter (Signed)
Requested Prescriptions   Pending Prescriptions Disp Refills  . NAMZARIC 28-10 MG CP24 [Pharmacy Med Name: NAMZARIC 28 MG-10 MG CAPSULE] 90 capsule 1    Sig: TAKE 1 CAPSULE BY MOUTH EVERY DAY   Rx last filled:09/30/18 #90 1 refils  Pt last seen:12/12/18  Follow up appt scheduled:06/13/2019

## 2019-04-23 ENCOUNTER — Encounter: Payer: Self-pay | Admitting: Psychology

## 2019-05-22 ENCOUNTER — Ambulatory Visit (INDEPENDENT_AMBULATORY_CARE_PROVIDER_SITE_OTHER): Payer: BC Managed Care – PPO | Admitting: Counselor

## 2019-05-22 ENCOUNTER — Other Ambulatory Visit: Payer: Self-pay

## 2019-05-22 DIAGNOSIS — F03B0 Unspecified dementia, moderate, without behavioral disturbance, psychotic disturbance, mood disturbance, and anxiety: Secondary | ICD-10-CM

## 2019-05-22 DIAGNOSIS — G3101 Pick's disease: Secondary | ICD-10-CM

## 2019-05-22 DIAGNOSIS — F0280 Dementia in other diseases classified elsewhere without behavioral disturbance: Secondary | ICD-10-CM

## 2019-05-22 DIAGNOSIS — F0390 Unspecified dementia without behavioral disturbance: Secondary | ICD-10-CM

## 2019-05-22 DIAGNOSIS — R482 Apraxia: Secondary | ICD-10-CM

## 2019-05-22 NOTE — Progress Notes (Signed)
[facility]  Patient Name: Sharon Evans MRN: [id number] Date of Birth: 1960-08-07 Age: 59 y.o. 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 She was referred by Dr. Tomi Likens who has been following her since her diagnosis in 2018. She was seen previously for a neuropsychological evaluation by Dr. Bonita Quin (Neuropsychologist; 03/23/2017) and demonstrated very significant impairment at that time. Reviewing the test scores, I would note that her list learning memory was actually an area of relative preservation as compared to her other test scores, despite still falling at an extremely low level, suggesting relative preservation of memory abilities. Her early symptom history is clearly documented in the series of notes by Dr. Tomi Likens and Dr. Si Raider, which were reviewed and are appreciated and will not be reiterated in detail here for the sake of brevity. The patient was attended by her husband [name], and her caregiver Pam.   On interview, the patient's husband stated that he started seeing "small signs" of changes as early as 2016. He noticed that she was having minor problems with placement when parking and started to have a hard time cooking, she was spilling things. She started struggling with overloading the dishwasher, difficulty placing the dishes in the right spot, and her issues worsened from there. She was spilling things whenever she cooked (he's not sure if that was due to issues reaching under visual guidance, I.e., optic ataxia, or what). 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 He recalls signing the documents in August of 2018 for a car they were buying because she could not write her own signature.  She has problems with right/left discrimination on their walks. She has notable apraxia and also started having a hard time using utensils (specifically a knife) back in 2018. She has a hard time brushing her teeth but she is able to use the remote. She was still able to dress herself at that time. On interview, they are actually denying that she has much in the way of memory problems (though I see they previously reported memory problems) and they denied that she repeats herself or asks repetitive questions. 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 With respect to language, they are saying that she often gets jumbled and has a hard time finishing sentences but they are denying that she has any articulatory groping, difficulties forming speech sounds, or other symptoms suggestive of speech praxis problems. They are denying that she has problems understanding the meaning of a specific word. They haven't noticed any getting lost indoors and they are denying that she had problems with navigation previously. She has no odd or inappropriate behaviors but they have noticed that she now has cravings for sweets although it doesn't sound very excessive. They do notice that she now has very restricted food preferences. The patient reported that her mood is good. Her husband thinks she is aware of her memory problems but when I asked her, she stated that she does not think she has memory problems so I do think her insight is questionable. With respect to appetite, she is doing well. With respect to energy, they think she does sleep during the day somewhat, but not excessively. They are denying any  RBD or any visual hallucinations or mistaken beliefs. They haven't noticed any compulsive or repetitive behaviors or collecting behaviors.   They do not appreciate any problems with movement and say that she still walks very well and very fast. She has no falls. She has no  tremors. They haven't noticed any diminished facial expressiveness. They have not noticed any alien limb type phenomena, myoclonus, or dystonia.   With respect to functioning, Sharon Evans stopped driving in [id number], they noticed that she was having a hard time operating a new car that she got for her birthday (couldn't figure out the push button start). Her husband described her as an "excellent" driver previously. She was working until 2018 but had to retire, her husband stated they were aware of her problems at work. She stopped cooking at least 3 years ago, she slowly moved away from it. They aren't sure if she is having any difficulties with orientation, they don't ask, but they haven't noticed her asking the date constantly. Her husband uses a calendar to help orient her. They don't think she is able to read it, and noticed that she was having problems using electronic screens as far back as 2018. At present, she does not have any meaningful function around the house, she abandoned most of her chores around 2018. They think she would be capable of doing some simple things such as putting clothes away but she was having a hard time operating the washing machine and using hangars in 2018. She no longer ventures out into the community alone and is always accompanied. With respect to basic activities of daily living, she is able to brush her teeth herself (although her husband doesn't think she does a good job). She showers herself although they wonder if she may need help (her husband doesn't observe) but they haven't noticed any smelliness or issues suggesting she is doing inadequately. She has started having some incontinence recently, she was incontinent of feces about a month ago. Sometimes she has a hard time remembering to take her briefs off when she goes to the bathroom.   Past Medical History and Review of Relevant Studies   There are no problems to display for this patient.  Review of Neuroimaging  and Relevant Studies:  I had a chance to personally review Sharon. Kern's neuroimaging, an MRI of the brain from 02/19/2017, which shows a significant burden of atrophy for someone her age mainly within the frontal and parietal regions. While not striking, there is notable asymmetry with greater atrophy in the left hemisphere. This is visible in pulling of the frontal and occipital horns on that side as well as relatively greater sulcal width. The midbrain volume is well preserved and there is relative sparing of the temporal lobes. There is no significant leukoaraiosis to speak off. 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   Dr. Georgie Chard notes were reviewed and are appreciated. I would note that she had no signs of Parkinsonism on elemental neurological exam, she did have apraxia and difficulties following instructions.   Montreal Cognitive Assessment  02/06/2017  Visuospatial/ Executive (0/5) 0  Naming (0/3) 2  Attention: Read list of digits (0/2) 0  Attention: Read list of letters (0/1) 0  Attention: Serial 7 subtraction starting at 100 (0/3) 0  Language: Repeat phrase (0/2) 1  Language : Fluency (0/1) 0  Abstraction (0/2) 0  Delayed Recall (0/5) 0  Orientation (0/6) 5  Total 8  Adjusted Score (based on education) 8  MMSE - Mini Mental State Exam 12/12/2018 05/28/2018  Orientation to time 1 2  Orientation to Place 4 3  Registration 3 3  Attention/ Calculation 0 0  Recall 0 1  Language- name 2 objects 2 1  Language- repeat 1 1  Language- follow 3 step command 3 0  Language- read & follow direction 1 1  Write a sentence 0 0  Copy design 0 0  Total score 15 12   Current Outpatient Medications  Medication Sig Dispense Refill  . NAMZARIC 28-10 MG CP24 TAKE 1 CAPSULE BY MOUTH EVERY DAY 90 capsule 1  . alendronate (FOSAMAX) 70 MG tablet Take 70 mg by mouth once a week.  11  . diazepam (VALIUM) 5 MG tablet Take 15 to 30 minute prior to MRI (Patient not  taking: Reported on 12/12/2018) 1 tablet 0  . escitalopram (LEXAPRO) 10 MG tablet Take 10 mg by mouth daily.     No current facility-administered medications for this visit.    Family History  Problem Relation Age of Onset  . Heart disease Mother   . Heart attack Father   . Dementia Neg Hx    There is no  family history of dementia. Her father passed around 18-65 of a heart attack. Her mother passed away of a heart attack around 22 and was still of sound mind. She had a biological brother who passed away around 45 or 29, they aren't sure why. They don't think there is any family history of early onset dementia but there is some dementia much later in life (a paternal aunt in her 25s). There is no  family history of psychiatric illness.  Psychosocial History  Developmental, Educational and Employment History: The patient reported that she was never held back, she denied any learning difficulties, and she earned A's and B's and was in the honors program. She went on to earn a bachelors degree from [employer] in [place]. She worked at [employer], in [place], and she enjoyed that very much. She did that for roughly 20 years. 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   Psychiatric History: They denied any history of psychiatric illness earlier in life. She has been on antidepressants since around the time of her diagnosis. She has a script for diazepam but no longer takes it.   Substance Use History: The patient denied any significant history of alcohol or substance use problems.   Relationship History and Living Cimcumstances: The patient and her husband have been married for 23 years and they were together for 4 years before that. They have two children, both sons, 59 and 56. Their 74 year old son still lives with them.   Mental Status and Behavioral Observations  Sensorium/Arousal: The patient's level of arousal was  awake and alert.  Orientation: The patient was oriented to person and place only, she was off on the year (2020) and was not well oriented to situation.  Appearance: Dressed neatly in appropriate, casual clothing.  Behavior: The patient presented as quite impaired and had a difficult time even following very simple commands and organizing her behavior. She was quite upset at the testing, which was minimized to the extent possible, and she became tearful at multiple times throughout the evaluation.  Speech/language: Speech had an empty quality, with occasional phonemic paraphasic errors and word finding problems, but there were no frank speech praxis problems, there was no aggramatism, and her speech was fluent if not somewhat halting.  Gait/Posture: Appeared normal on observation of ambulation between rooms.  Movement: I did not notice any overt signs of Parkinsonism, any myoclonus, or other motor abnormalities.  Social Comportment: The patient participated to the best of her ability, which was challenging given her current cognitive status.  Mood: Patient had a hard time reporting her mood, no agitation as per her husband and caregiver Pam Affect: Neutral Thought process/content: Thought process was fragmented and she often lost track of what she was doing in the midst of even simple tasks.  Safety: No safety concerns were identified at this visit.  Insight: Impaired.   MMSE - Mini Mental State Exam 05/22/2019 12/12/2018 05/28/2018  Orientation to time 1 1 2   Orientation to Place 3 4 3   Registration 3 3 3   Attention/ Calculation 0 0 0  Recall 1 0 1  Language- name 2 objects 2 2 1   Language- repeat 0 1 1  Language- follow 3 step command 1 3 0  Language- read & follow direction 0 1 1  Write a sentence 0 0 0  Copy design 0 0 0  Total score 11 15 12     Neuropsychological and Neurobehavioral Test Findings   Orientation: Sharon Evans was oriented to person and place only.   Cortical Motor and  Sensory Functioning: Limb praxis was assessed for hammering, screwdriver use, scrambling, and scissor use bilaterally and was significantly impaired on both sides with 0/4 items. She had a mix of content, spatial, and other errors. Finger gnosis was also impaired as she was not able to identify her index finger. She had problems with identifying body parts in general, such as pointing to her nose. Graphesthesia was attempted but she wasn't able to reliably complete items with her eyes open so it is unlikely to be reliable. Orobuccal praxis was not assessed due to COVID precautions.   Attention/Working Memory: Sharon Evans demonstrated significant impairment on tasks of attention and working memory, she did not correctly count from 1 to 10. On formal testing her digit repetition forward was extremely low(T = 19) at only 3 digits forward consistently and she was unable to repeat even one string of 3 digits backward (T = 19). She completed 0/5 serial subtractions of 7 from 100 and could not even start when asked to spell the word "world" backwards.   Language: Qualitatively, Sharon Evans demonstrated fluent but empty speech, with intact naming on screening assessment, impaired repetition, and relatively well preserved comprehension. Her ability to follow commands was very poor, however, due to problems orienting her limbs in space when asked to do things such as point to various objects in the room. Her animal fluency and phonemic fluency were both extremely low (Animals: T = 11, FAS: T = 16). Oral production in response to a picture stimulus was extremely low.  [place] Screening Test Naming: 10/10 Automatic Speech: 4/10 Repetition: 6/10 Yes/No Responses: 8/10 Following Instructions: 2/10  Visuospatial Function: Performance was notably and significant impaired on visuospatial measures. She had notable agraphia and was not able to write so much as one letter of the alphabet or write her name when  asked. [place] cross drawing and the overlapping pentagons of the MMSE were attempted but were discontinued with little more than a scribble produced. While her description of a family barbecue scene was quite basic, she was able to identify that it was a family barbecue scene, which might argue against simultanagnosia.   Memory: Memory was briefly assessed and again represents an area of  relative preservation despite still being impaired. She was able to repeat 3/3 words on immediate recall from the MMSE and then recalled 1/3 words after a brief delay. Qualitatively, she did not repeat herself.   Executive Function: Executive function was not formally assessed but is presumably impaired given difficulties following even simple instructions and sequencing behavior.  Rating Scales: The patient's husband and caregiver completed a the Quick Dementia Severity Rating System and put her at the cusp of a severe dementia level of functioning. I was able to rate a CDR for her and her Sum of Boxes score is a 13, which is late moderate dementia.    Clinical Impressions  This is a 59 year old, right-hand dominant patient of Dr. Georgie Chard with a previous history of early onset dementia, presumably Alzheimer's disease, that was diagnosed around 2018. Her husband reported that he noticed some "small" changes before then in 2016, such as difficulties with placement when parking and problems cooking and correctly placinging items in the dishwasher, possibly due to visospatial/praxis problems. Her husband largely denies noticing much in the way of memory problems and interestingly, memory did represent an area of relative preservation on some of her previous test findings but her memory is certainly impaired. Her husband reports language problems although after interacting with her, I think she has more empty speech as opposed to any speech praxis issues and I did not notice any aggramatism. She has notable apraxic/parietal  features and started having problems with using utensils, agraphia, and problems using appliances in 2018. 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 They denied any movement changes, she had no Parkinsonism on exam with Dr. Tomi Likens, and she has no myoclonus, dystonia, or alien limb type phenomena. She has an MRI of the brain that, to my eye, shows fairly significant atrophy for a woman her age with some noticeable asymmetry (L > R), mainly in the frontal and parietal lobes and no significant cerebrovascular disease.   Formal neuropsychological testing was limited by the patients severity (MMSE of 11 today), but I was able to conduct some limited testing and she is globally and significantly impaired. 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 I attempted to test for graphesthesia and sensory neglect but she was not able to participate relative to her level of impairment. Despite her left sided imaging predominance, her language was relatively preserved with fluent but empty speech, intact naming on screening assessment, and fairly good comprehension. Her mental status screening, CDR, and informant ratings all place her at a moderate to late moderate dementia level of functioning.   Sharon Evans is clearly demonstrating early onset dementia, currently at a late moderate level of progression. I am struck by her very significant parietal signs and symptoms and imaging appearance, which are characteristic of corticobasal syndrome (although she does not meet full criteria). 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    Recommendations  Your performance and presentation on assessment today were consistent with impaired performance in most areas, similar to your last assessment. I agree with Dr. Bonita Quin and Dr. 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 Dementia refers to a group of syndromes where  multiple areas of ability are damaged in the brain, such as memory, thinking, judgment, and behavior, and most commonly refer to age related causes of dementia that cause worsening in these abilities over time. Alzheimer's disease is the most common form of dementia in people over the age of 71. Not all dementias are Alzheimer's disease, but all Alzheimer's disease is dementia. When dementia is due to an underlying condition affecting the brain, such as Alzheimer's disease, there is progression over time, which typically procedures gradually over many years.   Many different conditions can cause dementia. In your case, I do think that early onset Alzheimer's disease is the most likely cause of your symptoms although you have an atypical presentation. Typically, individuals with Alzheimer's disease demonstrate notable memory problems although that is not always the case. Alzheimer's disease can also result in a syndrome called "corticobasal syndrome", which very much resembles many of the problems you are having with using utensils, appliances, hand writing, and knowing where your body is in space. Your MRI of the brain also shows an atrophy pattern that is often observed in corticobasal syndrome type presentations, with shrinkage in the parietal and frontal lobes and relative sparing of the temporal lobes (the structures that typically shrink the most in typical late-onset Alzheimer's disease). There is some possibility that another condition besides Alzheimer's disease is causing your problems but I do not see any hallmark signs or symptoms to suggest another  pathology.   If diagnostic certainty (to the extent possible in life) is a goal of care for you, the next step would probably be a CSF tap for Alzheimer's disease bio markers. That would require a lumbar puncture and would allow [place] to look for misfolded proteins in the fluid that have fairly good sensitivity and specificity for detecting if Alzheimer's disease is the source of your problems. At this point in the disease, however, it may not be worth it because we are confident that you have a degenerative condition and this would not alter your treatment or prognosis. If you were interested in such a test, you could discuss it with Dr. Tomi Likens to see if he thinks it is medically appropriate.   Dementia can generally be grouped into three different stages of disease: Mild, Moderate, and Severe. Your test data, Clinical Dementia Rating, and report of your functioning at home all make me think you are in the moderate stages of disease. The following excerpty from the Alzheimer's Association describes typical symptoms in moderate stage dementia:   Middle-stage Alzheimer's (moderate)  Middle-stage Alzheimer's is typically the longest stage and can last for many years. As the disease progresses, the person with Alzheimer's will require a greater level of care.  During the middle stage of Alzheimer's, the dementia symptoms are more pronounced. the person may confuse words, get frustrated or angry, and act in unexpected ways, such as refusing to bathe. Damage to nerve cells in the brain can also make it difficult for the person to express thoughts and perform routine tasks without assistance.  Symptoms, which vary from person to person, may include:  Being forgetful of events or personal history.  ?Feeling moody or withdrawn, especially in socially or mentally challenging situations.  Being unable to recall information about themselves like their address or telephone number, and the high school or college  they attended.  Experiencing confusion about where they are or what day it is.  Requiring help choosing proper clothing for the season or the occasion.  Having trouble controlling  their bladder and bowels.  Experiencing changes in sleep patterns, such as sleeping during the day and becoming restless at night.  Showing an increased tendency to wander and become lost.  Demonstrating personality and behavioral changes, including suspiciousness and delusions or compulsive, repetitive behavior like hand-wringing or tissue shredding.  In the middle stage, the person living with Alzheimer's can still participate in daily activities with assistance. It's important to find out what the person can still do or find ways to simplify tasks. As the need for more intensive care increases, caregivers may want to consider respite care or an adult day center so they can have a temporary break from caregiving while the person living with Alzheimer's continues to receive care in a safe environment.  There are no safety issues at present because you are supervised most of the time, you do not drive, and you are not working and those are all appropriate at your stage of illness.   Genetic testing could be considered, given the early onset nature of your condition, because sometimes early onset dementias can run in families. With that said, you did not report a family history that makes me particularly concerned that you have a causative mutation.   Some individuals with Alzheimer's disease find it meaningful to contribute to Alzheimer's prevention and research efforts through engaging in clinical studies, brain donation, or other ways to help fight the disease. If you are interested in participating in clinical research or trials, the nearest [place] is at [facility]. They have a brain bank program and ongoing clinical research. They can be contacted at [phone].   Viviano Simas  Nicole Kindred, PsyD, [facility] Clinical Neuropsychologist  Informed Consent and Coding/Compliance  Risks and benefits of the evaluation were discussed with the patient as were the limits of confidentiality. I conducted a clinical interview and neuropsychological testing (more than two tests) with Laurel Dimmer. The patient was able to tolerate the testing procedures and the patient (and/or family if applicable) is likely to benefit from further follow up to receive the diagnosis and treatment recommendations, which will be rendered at the next encounter. Billing below reflects technician time, my direct face-to-face time with the patient, time spent in test administration, and time spent in professional activities including but not limited to: neuropsychological test interpretation, integration of neuropsychological test data with clinical history, report preparation, treatment planning, care coordination, and review of diagnostically pertinent medical history or studies.   Services associated with this encounter: Clinical Interview [id number]) plus 60 minutes [id number]; Neuropsychological Evaluation by Professional)  120 minutes [id number]; Neuropsychological Evaluation by Professional, [place].) 30 minutes [id number]; Test Administration by Professional) 30 minutes [id number]; Test Administration by Professional, [place].)

## 2019-05-26 ENCOUNTER — Encounter: Payer: Self-pay | Admitting: Neurology

## 2019-05-29 ENCOUNTER — Other Ambulatory Visit: Payer: Self-pay

## 2019-05-29 ENCOUNTER — Telehealth (INDEPENDENT_AMBULATORY_CARE_PROVIDER_SITE_OTHER): Payer: BC Managed Care – PPO | Admitting: Counselor

## 2019-05-29 DIAGNOSIS — R482 Apraxia: Secondary | ICD-10-CM

## 2019-05-29 DIAGNOSIS — F03B0 Unspecified dementia, moderate, without behavioral disturbance, psychotic disturbance, mood disturbance, and anxiety: Secondary | ICD-10-CM

## 2019-05-29 DIAGNOSIS — F0390 Unspecified dementia without behavioral disturbance: Secondary | ICD-10-CM

## 2019-05-29 NOTE — Patient Instructions (Signed)
Your performance and presentation on assessment today were consistent with impaired performance in most areas, similar to your last assessment. I agree with Dr. Bonita Quin and Dr. 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 Dementia refers to a group of syndromes where multiple areas of ability are damaged in the brain, such as memory, thinking, judgment, and behavior, and most commonly refer to age related causes of dementia that cause worsening in these abilities over time. Alzheimer's disease is the most common form of dementia in people over the age of 21. Not all dementias are Alzheimer's disease, but all Alzheimer's disease is dementia. When dementia is due to an underlying condition affecting the brain, such as Alzheimer's disease, there is progression over time, which typically procedures gradually over many years.   Many different conditions can cause dementia. In your case, I do think that early onset Alzheimer's disease is the most likely cause of your symptoms although you have an atypical presentation. Typically, individuals with Alzheimer's disease demonstrate notable memory problems although that is not always the case. Alzheimer's disease can also result in a syndrome called "corticobasal syndrome", which very much resembles many of the problems you are having with using utensils, appliances, hand writing, and knowing where your body is in space. Your MRI of the brain also shows an atrophy pattern that is often observed in corticobasal syndrome type presentations, with shrinkage in the parietal and frontal lobes and relative sparing of the temporal lobes (the structures that typically shrink the most in typical late-onset Alzheimer's disease). There is some possibility that another condition besides Alzheimer's disease is causing your problems but I do not see any hallmark signs or symptoms to suggest another pathology.   We discussed next steps in your workup and the role of a SPECT DaT scan,  which if negative would give [place] greater diagnostic certainty that this is in fact due to Alzheimer's disease. If positive, then the condition could be due to corticobasal degeneration (most likely), but progressive supranuclear palsy pathology and lewy body pathology would also be within the differential.   Dementia can generally be grouped into three different stages of disease: Mild, Moderate, and Severe. Your test data, Clinical Dementia Rating, and report of your functioning at home all make me think you are in the moderate stages of disease. The following excerpty from the Alzheimer's Association describes typical symptoms in moderate stage dementia:   Middle-stage Alzheimer's (moderate)  Middle-stage Alzheimer's is typically the longest stage and can last for many years. As the disease progresses, the person with Alzheimer's will require a greater level of care.  During the middle stage of Alzheimer's, the dementia symptoms are more pronounced. the person may confuse words, get frustrated or angry, and act in unexpected ways, such as refusing to bathe. Damage to nerve cells in the brain can also make it difficult for the person to express thoughts and perform routine tasks without assistance.  Symptoms, which vary from person to person, may include:  Being forgetful of events or personal history.  ?Feeling moody or withdrawn, especially in socially or mentally challenging situations.  Being unable to recall information about themselves like their address or telephone number, and the high school or college they attended.  Experiencing confusion about where they are or what day it is.  Requiring help choosing proper clothing for the season or the occasion.  Having trouble controlling their bladder and bowels.  Experiencing changes in sleep patterns, such as sleeping during the day and becoming restless at night.  Showing an increased tendency to wander and become lost.  Demonstrating  personality and behavioral changes, including suspiciousness and delusions or compulsive, repetitive behavior like hand-wringing or tissue shredding.  In the middle stage, the person living with Alzheimer's can still participate in daily activities with assistance. It's important to find out what the person can still do or find ways to simplify tasks. As the need for more intensive care increases, caregivers may want to consider respite care or an adult day center so they can have a temporary break from caregiving while the person living with Alzheimer's continues to receive care in a safe environment.  There are no safety issues at present because you are supervised most of the time, you do not drive, and you are not working and those are all appropriate at your stage of illness.   Genetic testing could be considered, given the early onset nature of your condition, because sometimes early onset dementias can run in families. With that said, you did not report a family history that makes me particularly concerned that you have a causative mutation.   Some individuals with Alzheimer's disease find it meaningful to contribute to Alzheimer's prevention and research efforts through engaging in clinical studies, brain donation, or other ways to help fight the disease. If you are interested in participating in clinical research or trials, the nearest [place] is at [facility]. They have a brain bank program and ongoing clinical research. They can be contacted at [phone].

## 2019-05-29 NOTE — Progress Notes (Signed)
   [facility]  I met with Sharon Evans to review the findings resulting from her neuropsychological evaluation. She was attended by her husband Sharon Evans and caregiver Sharon Evans. Since the last appointment, she has been about the same. Time was spent reviewing the impressions and recommendations that are detailed in the evaluation report. 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 We talked about next steps and they were most interested in a SPECT DaT scan, which I have already discussed with Dr. Tomi Likens. I encouraged them to contact him. Interventions provided during this encounter included psychoeducation, care coordination, and empathic reflection. I also provided them with the AFTD website, which has helpful information about corticobasal syndrome and non-AD dementias. I took time to explain the findings and answer all the patient's questions. I encouraged Sharon Evans to contact me should they have any further questions or if further follow up is desired.   Current Medications and Medical History   Current Outpatient Medications  Medication Sig Dispense Refill  . NAMZARIC 28-10 MG CP24 TAKE 1 CAPSULE BY MOUTH EVERY DAY 90 capsule 1  . alendronate (FOSAMAX) 70 MG tablet Take 70 mg by mouth once a week.  11  . diazepam (VALIUM) 5 MG tablet Take 15 to 30 minute prior to MRI (Patient not taking: Reported on 12/12/2018) 1 tablet 0  . escitalopram (LEXAPRO) 10 MG tablet Take 10 mg by mouth daily.     No current facility-administered medications for this visit.   There are no problems to display for this patient.  Mental Status and Behavioral Observations  Sharon Evans and her husband Sharon Evans were available  at the time of their video appointment. Sharon Evans was not well oriented but participated to the extent possible. Her speech continues to have an empty quality. Self-reported mood was good and her affect was mainly euthymic. Thought process was somewhat fragmented, although there was no disorganization suggestive of a psychotic process, and content was appropriate.   Plan  Feedback provided regarding the patient's neuropsychological evaluation. I discussed next steps in the workup with Ms. Sharon Evans and her family, and they are most interested in a SPECT DaT scan to see if this is likely an atypical Parkinsonian condition or Alzheimer's disease. I encouraged them to follow up with Dr. Tomi Likens. If desired, I am happy to follow up with Sharon Evans and her family to review the results of her scan. Sharon Evans was encouraged to contact me if any questions arise or if further follow up is desired.   Sharon Evans Sharon Kindred, PsyD, ABN Clinical Neuropsychologist  Service(s) Provided at This Encounter: 60 minutes [phone]; Psychotherapy with patient/family)

## 2019-05-30 ENCOUNTER — Telehealth: Payer: Self-pay

## 2019-05-30 NOTE — Telephone Encounter (Signed)
Left message for patient to call office to discuss patient consent form for the dat scan.

## 2019-06-02 ENCOUNTER — Telehealth: Payer: Self-pay

## 2019-06-02 NOTE — Telephone Encounter (Signed)
-----   Message from Pieter Partridge, DO sent at 05/30/2019  8:30 AM EST ----- I would like to order a SPECT DaT scan for this patient to evaluate for Alzheimer's disease vs corticobasal degeneration.

## 2019-06-02 NOTE — Telephone Encounter (Signed)
Left message for patient to call office, I will need her to sign data scan paper to go any further for scheduling this. Two messages left to call.

## 2019-06-02 NOTE — Telephone Encounter (Signed)
I left message at [phone] for patient again to call office.

## 2019-06-03 ENCOUNTER — Telehealth: Payer: Self-pay

## 2019-06-03 NOTE — Telephone Encounter (Signed)
I have left multiple messages regarding Dat scan, pt must sign consent before we go forward. 5 unattempted calls, please advise. No response .

## 2019-06-03 NOTE — Telephone Encounter (Signed)
-----   Message from Pieter Partridge, DO sent at 05/30/2019  8:30 AM EST ----- I would like to order a SPECT DaT scan for this patient to evaluate for Alzheimer's disease vs corticobasal degeneration.

## 2019-06-03 NOTE — Telephone Encounter (Signed)
Left message to contact office. Try husbands number Gwyndolyn Saxon [phone].

## 2019-06-03 NOTE — Telephone Encounter (Signed)
At this time they will not pursue the Dat Scan.

## 2019-06-03 NOTE — Telephone Encounter (Signed)
I would leave a voicemail to call back office.  Otherwise, we can send a certified letter for patient's husband to contact office to obtain consent for this test.

## 2019-06-11 NOTE — Telephone Encounter (Signed)
Close encounter 

## 2019-06-13 ENCOUNTER — Ambulatory Visit: Payer: BC Managed Care – PPO | Admitting: Neurology

## 2019-07-29 NOTE — Progress Notes (Signed)
Virtual Visit via Video Note The purpose of this virtual visit is to provide medical care while limiting exposure to the novel coronavirus.    Consent was obtained for video visit:  Yes.   Answered questions that patient had about telehealth interaction:  Yes.   I discussed the limitations, risks, security and privacy concerns of performing an evaluation and management service by telemedicine. I also discussed with the patient that there may be a patient responsible charge related to this service. The patient expressed understanding and agreed to proceed.  Pt location: Home Physician Location: office Name of referring provider:  Gaynelle Arabian, MD I connected with Sharon Evans at patients initiation/request on 07/30/2019 at 10:30 AM EDT by video enabled telemedicine application and verified that I am speaking with the correct person using two identifiers. Pt MRN:  [id number] Pt DOB:  May 17, 1960 Video Participants:  Sharon Evans   History of Present Illness:  [name] is a 59 year old right-handed Caucasian woman with hypertension, hypercholesterolemia, and history of basal cell carcinoma on chest who follows up for early onset Alzheimer's dementia. She is accompanied by her husband and caregiver who supplements history.  UPDATE: Current medication: Namzaric; Lexapro 10mg  daily  She underwent neuropsychological testing which demonstrated predominantly parietal signs such as apraxia, as well as Gerstmann's syndrome.  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  We discussed ordering a daTscan but patient and husband deferred for the time being as there wouldn't be any .  Results discussed with Dr. Nicole Kindred.  She has a part-time companion that spends time with her and will drive her to places she needs to be. She has had a  decline in regards to apraxia.  She is still able to use the toilet but requires assistance with bathing/hygiene, dressing, and eating.  Her memory has been fairly good.  Since onset of the COVID pandemic, her husband has been working from home and has been able to be with her. He will be working from home at least until next Fall.  HISTORY: She has been concerned about her memory since late 2017. Initially, she would at times forget her purse when leaving the house. Other times, she would stop mid-sentence because she can't get the word out (not word-finding difficulty). She appeared anxious and tremulous, so she was started on an SSRI for anxiety. Her anxiety improved and she was no longer stuttering to find words. She does report continued stress, however. More recently, her husband endorsed concerns about her memory. On a couple of occasions, she heated food in a ceramic bowl on the stove and burnt the bowl. She has struggled trying to open her locked car for a couple of minutes before her husband had to remind her that she forgot the keys. She is having increased trouble remembering where common items are in the house. She had missed paying her credit card balance, which is new. She still drives without difficulty. Her husband has not been concerned riding in the passenger seat with her. However, one time she was driving in the car with her son when she missed a turn and had to drive the wrong way on a street until she was able to maneuver and turn around. This upset her son. She is more tremulous and is having trouble signing her name. She denies headaches, visual disturbance, gait disturbance or falls.  MRI of brain  with and without contrast from 02/19/17 was personally reviewed and revealed mild cortical volume loss along the convexities. She underwent neuropsychological testing on 03/23/17, which demonstrated severe and global cognitive impairment, likely consistent with  early-onset Alzheimer's disease.  She graduated college with a Water quality scientist. She used to work in Nurse, children's. She drinks maybe one beer a week. She does not use illicit drugs. There is no known family history of dementia or other neurodegenerative disease.  Labs include TSH 2.29, B12 311.  Her husband isPOA for both health care and finances.  Past Medical History: Past Medical History:  Diagnosis Date  . Alzheimer's dementia ([place])   . Basal cell carcinoma   . Hypercholesterolemia   . Hypertension     Medications: Outpatient Encounter Medications as of 07/30/2019  Medication Sig  . NAMZARIC 28-10 MG CP24 TAKE 1 CAPSULE BY MOUTH EVERY DAY  . alendronate (FOSAMAX) 70 MG tablet Take 70 mg by mouth once a week.  . diazepam (VALIUM) 5 MG tablet Take 15 to 30 minute prior to MRI (Patient not taking: Reported on 12/12/2018)  . escitalopram (LEXAPRO) 10 MG tablet Take 10 mg by mouth daily.   No facility-administered encounter medications on file as of 07/30/2019.    Allergies: No Known Allergies  Family History: Family History  Problem Relation Age of Onset  . Heart disease Mother   . Heart attack Father   . Dementia Neg Hx     Social History: Social History   Socioeconomic History  . Marital status: Married    Spouse name: [name]  . Number of children: 2  . Years of education: BS  . Highest education level: Not on file  Occupational History  . Not on file  Tobacco Use  . Smoking status: Never Smoker  . Smokeless tobacco: Never Used  Substance and Sexual Activity  . Alcohol use: Yes    Alcohol/week: 2.0 standard drinks    Types: 1 Glasses of wine, 1 Cans of beer per week    Comment: 1-2 on week ends  . Drug use: No  . Sexual activity: Not on file  Other Topics Concern  . Not on file  Social History Narrative   Married ,lives with husband, two sons. Was previously in advertising.   Right. Caffeine - iced tea glass a day   Walk for exercise   Social  Determinants of Health   Financial Resource Strain:   . Difficulty of Paying Living Expenses:   Food Insecurity:   . Worried About Charity fundraiser in the Last Year:   . Arboriculturist in the Last Year:   Transportation Needs:   . Film/video editor (Medical):   Marland Kitchen Lack of Transportation (Non-Medical):   Physical Activity:   . Days of Exercise per Week:   . Minutes of Exercise per Session:   Stress:   . Feeling of Stress :   Social Connections:   . Frequency of Communication with Friends and Family:   . Frequency of Social Gatherings with Friends and Family:   . Attends Religious Services:   . Active Member of Clubs or Organizations:   . Attends Archivist Meetings:   Marland Kitchen Marital Status:   Intimate Partner Violence:   . Fear of Current or Ex-Partner:   . Emotionally Abused:   Marland Kitchen Physically Abused:   . Sexually Abused:     Observations/Objective:   No acute distress  Assessment and Plan:   1.  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  Namzaric 2.  Continue home care with caregiver 3.  Follow up in 6 months.  Follow Up Instructions:    -I discussed the assessment and treatment plan with the patient. The patient was provided an opportunity to ask questions and all were answered. The patient agreed with the plan and demonstrated an understanding of the instructions.   The patient was advised to call back or seek an in-person evaluation if the symptoms worsen or if the condition fails to improve as anticipated.     Dudley Major, DO

## 2019-07-30 ENCOUNTER — Telehealth (INDEPENDENT_AMBULATORY_CARE_PROVIDER_SITE_OTHER): Payer: BC Managed Care – PPO | Admitting: Neurology

## 2019-07-30 ENCOUNTER — Other Ambulatory Visit: Payer: Self-pay

## 2019-07-30 DIAGNOSIS — F0390 Unspecified dementia without behavioral disturbance: Secondary | ICD-10-CM | POA: Diagnosis not present

## 2019-07-30 DIAGNOSIS — F03B0 Unspecified dementia, moderate, without behavioral disturbance, psychotic disturbance, mood disturbance, and anxiety: Secondary | ICD-10-CM

## 2019-09-18 ENCOUNTER — Other Ambulatory Visit: Payer: Self-pay | Admitting: Neurology

## 2019-11-18 ENCOUNTER — Telehealth: Payer: Self-pay | Admitting: Neurology

## 2019-11-18 NOTE — Telephone Encounter (Signed)
Patient's husband called in wanting to speak with someone about how to get nurses to come in at night periodically.

## 2019-11-18 NOTE — Telephone Encounter (Signed)
OK to send home health referral.

## 2019-11-18 NOTE — Telephone Encounter (Signed)
LMOVM, Dr. Tomi Likens out of the office right now. Will send a message to him to let him know that husband wants to have a home health aide at night.

## 2019-11-18 NOTE — Telephone Encounter (Signed)
Telephone call to pt Husband, Per Home health pt had to have seen the provider in the last 90 days and the pt missed that by 2 weeks.   Advised husband to try and see if the PCP could do the referral if he had seen her in less then 90 days if not call us back and get added to the schedule and the wait list.

## 2019-12-19 DIAGNOSIS — Z0000 Encounter for general adult medical examination without abnormal findings: Secondary | ICD-10-CM | POA: Diagnosis not present

## 2019-12-19 DIAGNOSIS — E7800 Pure hypercholesterolemia, unspecified: Secondary | ICD-10-CM | POA: Diagnosis not present

## 2019-12-19 DIAGNOSIS — I10 Essential (primary) hypertension: Secondary | ICD-10-CM | POA: Diagnosis not present

## 2019-12-23 DIAGNOSIS — Z1211 Encounter for screening for malignant neoplasm of colon: Secondary | ICD-10-CM | POA: Diagnosis not present

## 2019-12-30 DIAGNOSIS — R05 Cough: Secondary | ICD-10-CM | POA: Diagnosis not present

## 2020-01-02 DIAGNOSIS — R05 Cough: Secondary | ICD-10-CM | POA: Diagnosis not present

## 2020-01-27 NOTE — Progress Notes (Signed)
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 She is accompanied by her husband and caregiver who supplements history.  I spoke with her caregiver, Sharon Evans, via phone.  UPDATE: Current medication: Namzaric  No longer on Lexapro 10mg  as she was stubborn.  Her caregiver works full-time during the day.  Her husband feels that he will soon need to get a nurse to help at night.  She does walk but not as much.  She sometimes has difficulty initiating steps and may have trouble stepping off the curb.  She has trouble bringing her fork to mouth.  She is now completely dependent on all ADL.  She does seem a little depressed and irritable but she is not combative.  She eats well and sleeps well.     HISTORY: She has been concerned about her memory since late 2017. Initially, she would at times forget her purse when leaving the house. Other times, she would stop mid-sentence because she can't get the word out (not word-finding difficulty). She appeared anxious and tremulous, so she was started on an SSRI for anxiety. Her anxiety improved and she was no longer stuttering to find words. She does report continued stress, however. More recently, her husband endorsed concerns about her memory. On a couple of occasions, she heated food in a ceramic bowl on the stove and burnt the bowl. She has struggled trying to open her locked car for a couple of minutes before her husband had to remind her that she forgot the keys. She is having increased trouble remembering where common items are in the house. She had missed paying her credit card balance, which is new. She still drives without difficulty. Her husband has not been  concerned riding in the passenger seat with her. However, one time she was driving in the car with her son when she missed a turn and had to drive the wrong way on a street until she was able to maneuver and turn around. This upset her son. She is more tremulous and is having trouble signing her name. She denies headaches, visual disturbance, gait disturbance or falls.  MRI of brain with and without contrast from 02/19/17 was personally reviewed and revealed mild cortical volume loss along the convexities. She underwent neuropsychological testing on 03/23/17, which demonstrated severe and global cognitive impairment, likely consistent with early-onset Alzheimer's disease.  Due to atypical presentation, she underwent repeat neuropsychological testing which demonstrated predominantly parietal signs such as apraxia, as well as Gerstmann's syndrome.  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  We discussed ordering a daTscan but patient and husband deferred for the time being as there wouldn't be any change in management.  She graduated college with a Water quality scientist. She used to work in Nurse, children's. She drinks maybe one beer a week. She does not use illicit drugs. There is no known family history of dementia or other neurodegenerative disease.  Labs include TSH 2.29, B12 311.  Her husband isPOA for both health care and finances.  PAST MEDICAL HISTORY: Past Medical History:  Diagnosis Date  . Alzheimer's dementia ([place])   . Basal cell carcinoma   . Hypercholesterolemia   . Hypertension  MEDICATIONS: Current Outpatient Medications on File Prior to Visit  Medication Sig Dispense Refill  . alendronate (FOSAMAX) 70 MG tablet Take 70 mg by mouth once a week.  11  . diazepam (VALIUM) 5 MG tablet Take 15 to 30 minute prior to MRI (Patient  not taking: Reported on 12/12/2018) 1 tablet 0  . escitalopram (LEXAPRO) 10 MG tablet Take 10 mg by mouth daily.    Marland Kitchen NAMZARIC 28-10 MG CP24 TAKE 1 CAPSULE BY MOUTH EVERY DAY 90 capsule 1   No current facility-administered medications on file prior to visit.    ALLERGIES: No Known Allergies  FAMILY HISTORY: Family History  Problem Relation Age of Onset  . Heart disease Mother   . Heart attack Father   . Dementia Neg Hx    SOCIAL HISTORY: Social History   Socioeconomic History  . Marital status: Married    Spouse name: Sharon Evans  . Number of children: 2  . Years of education: BS  . Highest education level: Not on file  Occupational History  . Not on file  Tobacco Use  . Smoking status: Never Smoker  . Smokeless tobacco: Never Used  Vaping Use  . Vaping Use: Never used  Substance and Sexual Activity  . Alcohol use: Yes    Alcohol/week: 2.0 standard drinks    Types: 1 Glasses of wine, 1 Cans of beer per week    Comment: 1-2 on week ends  . Drug use: No  . Sexual activity: Not on file  Other Topics Concern  . Not on file  Social History Narrative   Married ,lives with husband, two sons. Was previously in advertising.   Right. Caffeine - iced tea glass a day   Walk for exercise   Social Determinants of Health   Financial Resource Strain:   . Difficulty of Paying Living Expenses: Not on file  Food Insecurity:   . Worried About Charity fundraiser in the Last Year: Not on file  . Ran Out of Food in the Last Year: Not on file  Transportation Needs:   . Lack of Transportation (Medical): Not on file  . Lack of Transportation (Non-Medical): Not on file  Physical Activity:   . Days of Exercise per Week: Not on file  . Minutes of Exercise per Session: Not on file  Stress:   . Feeling of Stress : Not on file  Social Connections:   . Frequency of Communication with Friends and Family: Not on file  . Frequency of Social Gatherings with Friends and Family: Not on file  .  Attends Religious Services: Not on file  . Active Member of Clubs or Organizations: Not on file  . Attends Archivist Meetings: Not on file  . Marital Status: Not on file  Intimate Partner Violence:   . Fear of Current or Ex-Partner: Not on file  . Emotionally Abused: Not on file  . Physically Abused: Not on file  . Sexually Abused: Not on file    PHYSICAL EXAM: Blood pressure (!) 148/71, pulse 93, weight 140 lb (63.5 kg). General: No acute distress.  Patient appears well-groomed.   Head:  Normocephalic/atraumatic Eyes:  Fundi examined but not visualized Neck: supple, no paraspinal tenderness, full range of motion Heart:  Regular rate and rhythm Lungs:  Clear to auscultation bilaterally Back: No paraspinal tenderness Neurological Exam: alert and oriented to person and place. Unable to assess memory.  Apraxic.  Difficulty following commands.  Paraphasic errors with naming (said "finger" for  thumb).  Some mild difficulty with repeating.  Difficulty following directions to test tracking.  Otherwise, CN II-XII intact. Bulk and tone normal, muscle strength 5/5 throughout.  Deep tendon reflexes 2+ throughout.  Difficult following finger to nose testing.  Gait steady.  Romberg negative.  IMPRESSION: Probable early-onset Alzheimer's dementia.  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  PLAN: 1.  Restart Lexapro 10mg  daily to help with mood. 2.  Continue Namzaric 3.  Follow up in 6 months.  Metta Clines, DO  CC: Gaynelle Arabian, MD

## 2020-01-29 ENCOUNTER — Ambulatory Visit (INDEPENDENT_AMBULATORY_CARE_PROVIDER_SITE_OTHER): Payer: BC Managed Care – PPO | Admitting: Neurology

## 2020-01-29 ENCOUNTER — Encounter: Payer: Self-pay | Admitting: Neurology

## 2020-01-29 ENCOUNTER — Other Ambulatory Visit: Payer: Self-pay

## 2020-01-29 VITALS — BP 148/71 | HR 93 | Wt 140.0 lb

## 2020-01-29 DIAGNOSIS — G300 Alzheimer's disease with early onset: Secondary | ICD-10-CM

## 2020-01-29 DIAGNOSIS — F0280 Dementia in other diseases classified elsewhere without behavioral disturbance: Secondary | ICD-10-CM | POA: Diagnosis not present

## 2020-01-29 MED ORDER — ESCITALOPRAM OXALATE 10 MG PO TABS: 10.0000 mg | ORAL_TABLET | Freq: Every day | ORAL | 5 refills | Status: AC

## 2020-01-29 NOTE — Patient Instructions (Signed)
1.  I will restart escitalopram 10mg  daily 2.  Continue Namzaric daily

## 2020-03-25 ENCOUNTER — Other Ambulatory Visit: Payer: Self-pay | Admitting: Neurology

## 2020-07-29 ENCOUNTER — Ambulatory Visit: Payer: BC Managed Care – PPO | Admitting: Neurology

## 2020-08-05 NOTE — Progress Notes (Signed)
NEUROLOGY FOLLOW UP OFFICE NOTE  Sharon Evans [id number]  Assessment/Plan:   Probable early-onset Alzheimer's dementia.  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  1.  Continue Namzaric 2.  Continue Lexapro 10mg  daily 3.  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 She is accompanied by her husbandand caregiverwho supplements history.  I spoke with her caregiver, Sharon Evans, via phone.   UPDATE: Current medication: Namzaric, Lexapro  Difficulty eating easliy tired not walking as much -  Lightheaded and dizzy - dressing her brushing teeth and hair - sitting in booth or into care trouble   Her caregiver works full-time during the day.  She has another caregiver that helps at night.  She is not walking as much .  She has trouble bringing her fork to mouth but appetite is good.  She is now completely dependent on all ADL.  She needs assistance in appropriately sitting down in a booth or on the commode.  She has increased trouble getting words out.  She has been taking the Lexapro.  Mood is improved.  She sleeps well   HISTORY: She has been concerned about her memory since late 2017. Initially, she would at times forget her purse when leaving the house. Other times, she would stop mid-sentence because she can't get the word out (not word-finding difficulty). She appeared anxious and tremulous, so she was started on an SSRI for anxiety. Her anxiety improved and she was no longer stuttering to find words. She does report continued stress, however. More recently, her husband endorsed concerns about her memory. On a couple of occasions, she heated food in a ceramic bowl on the stove and burnt the bowl. She has struggled  trying to open her locked car for a couple of minutes before her husband had to remind her that she forgot the keys. She is having increased trouble remembering where common items are in the house. She had missed paying her credit card balance, which is new. She still drives without difficulty. Her husband has not been concerned riding in the passenger seat with her. However, one time she was driving in the car with her son when she missed a turn and had to drive the wrong way on a street until she was able to maneuver and turn around. This upset her son. She is more tremulous and is having trouble signing her name. She denies headaches, visual disturbance, gait disturbance or falls.  MRI of brain with and without contrast from 02/19/17 was personally reviewed and revealed mild cortical volume loss along the convexities. She underwent neuropsychological testing on 03/23/17, which demonstrated severe and global cognitive impairment, likely consistent with early-onset Alzheimer's disease.  Due to atypical presentation, she underwent repeat neuropsychological testing which demonstrated predominantly parietal signs such as apraxia, as well as Gerstmann's syndrome. 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 We discussed ordering a daTscan but patient and husband deferred for the time being as there wouldn't be any change in management.  She graduated college with a Water quality scientist. She used to work in Nurse, children's. She drinks maybe one beer a week. She does not use illicit drugs. There is no known family history of dementia or other neurodegenerative disease.  Labs include TSH 2.29, B12 311.  Her husband isPOA for both health care and finances.  PAST MEDICAL HISTORY: Past Medical History:  Diagnosis Date  . Alzheimer's dementia ([place])   .  Basal cell carcinoma   . Hypercholesterolemia   . Hypertension     MEDICATIONS: Current Outpatient Medications on File Prior to Visit  Medication Sig Dispense Refill  . alendronate (FOSAMAX) 70 MG tablet Take 70 mg by mouth once a week.  11  . escitalopram (LEXAPRO) 10 MG tablet Take 1 tablet (10 mg total) by mouth daily. 30 tablet 5  . NAMZARIC 28-10 MG CP24 TAKE 1 CAPSULE BY MOUTH EVERY DAY 90 capsule 1   No current facility-administered medications on file prior to visit.    ALLERGIES: No Known Allergies  FAMILY HISTORY: Family History  Problem Relation Age of Onset  . Heart disease Mother   . Heart attack Father   . Dementia Neg Hx       Objective:  Blood pressure (!) 143/68, pulse 79, height 5\' 2"  (1.575 m), weight 139 lb 3.2 oz (63.1 kg), SpO2 99 %. General: No acute distress.  Patient appears well-groomed.   Head:  Normocephalic/atraumatic Eyes:  Fundi examined but not visualized Neck: supple, no paraspinal tenderness, full range of motion Heart:  Regular rate and rhythm Lungs:  Clear to auscultation bilaterally Back: No paraspinal tenderness Neurological Exam: alert and oriented to person only. Unable to assess memory.  Apraxic.  Difficulty following commands.  Paraphasic errors with naming (said "finger" for thumb).  Some mild difficulty with repeating.  Difficulty following directions to test tracking.  Otherwise, CN II-XII intact. Bulk and tone normal, muscle strength 5/5 throughout.  Deep tendon reflexes 2+ throughout.  Unable to follow directions for finger-to-nose testing.  Gait steady.  Romberg negative.     Sharon Clines, DO  CC: Sharon Arabian, MD

## 2020-08-06 ENCOUNTER — Other Ambulatory Visit: Payer: Self-pay

## 2020-08-06 ENCOUNTER — Ambulatory Visit (INDEPENDENT_AMBULATORY_CARE_PROVIDER_SITE_OTHER): Payer: BC Managed Care – PPO | Admitting: Neurology

## 2020-08-06 ENCOUNTER — Encounter: Payer: Self-pay | Admitting: Neurology

## 2020-08-06 VITALS — BP 143/68 | HR 79 | Ht 62.0 in | Wt 139.2 lb

## 2020-08-06 DIAGNOSIS — R482 Apraxia: Secondary | ICD-10-CM | POA: Diagnosis not present

## 2020-08-06 DIAGNOSIS — F0280 Dementia in other diseases classified elsewhere without behavioral disturbance: Secondary | ICD-10-CM

## 2020-08-06 DIAGNOSIS — G300 Alzheimer's disease with early onset: Secondary | ICD-10-CM

## 2020-08-06 NOTE — Patient Instructions (Signed)
1.  Continue Namzaric and escitalopram 10mg  daily

## 2020-09-21 ENCOUNTER — Other Ambulatory Visit: Payer: Self-pay

## 2020-09-21 ENCOUNTER — Encounter: Payer: Self-pay | Admitting: Neurology

## 2020-09-21 ENCOUNTER — Telehealth: Payer: Self-pay | Admitting: Neurology

## 2020-09-21 DIAGNOSIS — G300 Alzheimer's disease with early onset: Secondary | ICD-10-CM

## 2020-09-21 DIAGNOSIS — F0280 Dementia in other diseases classified elsewhere without behavioral disturbance: Secondary | ICD-10-CM

## 2020-09-21 DIAGNOSIS — Z5320 Procedure and treatment not carried out because of patient's decision for unspecified reasons: Secondary | ICD-10-CM

## 2020-09-21 NOTE — Telephone Encounter (Signed)
Please see MyChart message from yesterday for more details.

## 2020-09-21 NOTE — Telephone Encounter (Signed)
My chart message sent to pt.

## 2020-09-22 ENCOUNTER — Telehealth: Payer: Self-pay

## 2020-09-22 ENCOUNTER — Other Ambulatory Visit: Payer: Self-pay

## 2020-09-22 DIAGNOSIS — Z5320 Procedure and treatment not carried out because of patient's decision for unspecified reasons: Secondary | ICD-10-CM

## 2020-09-22 NOTE — Progress Notes (Signed)
New referral added

## 2020-09-22 NOTE — Telephone Encounter (Signed)
Attempted to contact patient to schedule a Palliative Care consult appointment. No answer left a message to return call.  

## 2020-09-26 ENCOUNTER — Other Ambulatory Visit: Payer: Self-pay | Admitting: Neurology

## 2020-09-27 ENCOUNTER — Telehealth: Payer: Self-pay

## 2020-09-27 NOTE — Telephone Encounter (Signed)
Attempted to contact patient and patient's husband Sharon Evans to schedule a Palliative Care consult appointment. No answer left a message to return call. My chart message sent also.

## 2020-09-28 ENCOUNTER — Telehealth: Payer: Self-pay

## 2020-09-28 NOTE — Telephone Encounter (Signed)
Spoke with patient's husband Gwyndolyn Saxon and scheduled an in-person Palliative Consult for 10/19/20 @ [place] screening was negative. No pets in home. Patient lives with husband.   Consent obtained; updated Outlook/Netsmart/Team List and Epic.   Family is aware they may be receiving a call from NP the day before or day of to confirm appointment.

## 2020-10-01 ENCOUNTER — Other Ambulatory Visit: Payer: Self-pay

## 2020-10-01 MED ORDER — NAMZARIC 28-10 MG PO CP24: 1.0000 | ORAL_CAPSULE | Freq: Every day | ORAL | 0 refills | Status: DC

## 2020-10-19 ENCOUNTER — Other Ambulatory Visit: Payer: BC Managed Care – PPO | Admitting: Nurse Practitioner

## 2020-10-19 ENCOUNTER — Other Ambulatory Visit: Payer: Self-pay

## 2020-10-19 VITALS — BP 142/80 | HR 70 | Resp 18

## 2020-10-19 DIAGNOSIS — Z515 Encounter for palliative care: Secondary | ICD-10-CM

## 2020-10-19 DIAGNOSIS — F0280 Dementia in other diseases classified elsewhere without behavioral disturbance: Secondary | ICD-10-CM

## 2020-10-19 DIAGNOSIS — G3109 Other frontotemporal dementia: Secondary | ICD-10-CM | POA: Diagnosis not present

## 2020-10-19 NOTE — Progress Notes (Signed)
[facility] Palliative Care Consult Note Telephone: [phone]  Fax: [phone]   Date of encounter: 10/19/20 PATIENT NAME: Sharon Evans [address] [place] [address]   [phone] (home) [phone] (work) DOB: 10/24/1960 MRN: [id number]  PRIMARY CARE PROVIDER:    Gaynelle Arabian, MD,  [place] [place] [address] [phone]  REFERRING PROVIDER:   Gaynelle Arabian, MD [address] [place],  [place] [address] [phone]  RESPONSIBLE PARTY:    Contact Information     Name Relation Home Work Mobile   Terryl, Niziolek [phone]  [phone]     I met face to face with patient in home. Patient's husband and her caregiver Pam present during visit. Palliative Care was asked to follow this patient by consultation request of  Gaynelle Arabian, MD to address advance care planning and complex medical decision making. This is the initial visit.                                   ASSESSMENT AND PLAN / RECOMMENDATIONS:   Advance Care Planning/Goals of Care: Goals include to maximize quality of life and symptom management.  Visit consisted of building trust and discussions on Palliative care medicine as specialized medical care for people living with serious illness, aimed at facilitating improved quality of life through symptoms relief, assisting with advance care planning and establishing goals of care. Family expressed appreciation for education provided on Palliative care and how it differs from Hospice service. Our advance care planning conversation today included a discussion about:    The value and importance of advance care planning  Experiences with loved ones who have been seriously ill or have died  Exploration of personal, cultural or spiritual beliefs that might influence medical decisions  Exploration of goals of care in the event of a sudden injury or illness  Review and updating or  creation of an  advance directive document . Decision not to resuscitate due to poor prognosis. CODE STATUS: DNR Goal of care: Patient's goal of care is comfort. Husband desire for patient to remain at home through end of life. Directive: Patient has a living will. Her husband report patient does not want to be resuscitated in the event of cardiac or respiratory arrest. Patient does not have a DNR form. DNR form signed for patient today, form given to her husband to keep in a readily accessible area in home, copy uploaded to [facility] EMR.  The need to complete a MOST form was discussed, patient husband expressed interest in completing a MOST form, completion of the form was however deferred to another visit as patient became very emotional and tearful.Ab lank MOST form left with husband with informational leaflet. Form will be completed when family is ready.  and daughter expressed interest in completing a MOST form.  Palliative care will continue to provide support to patient, family and the medical team.  I spent 30 minutes providing this consultation. More than 50% of the time in this consultation was spent in counseling and care coordination. ------------------------------------------------------------------------------------  Symptom Management/Plan: Dementia: continue current plan of care. Maintain safety, prevent falls. Monitor for increased coughing or spluttering during meals as evidence of aspiration. Discussed possibility of trying PT/OT/ST to maintain/improve function. Patient's husband declined offer at this time saying he does not think it will benefit patient at this time. Continue to follow up with  Neurology as scheduled. Discussion on disease trajectory of dementia held with family, as it is progressive and terminal and likely to eventually lead to dysphagia, weight loss and immobility. Emotional and supportive care provided. Questions and concerns were addressed. Family was  encouraged to call with questions and/or concerns. Provided general support and encouragement, no other unmet needs identified at this time.  Follow up Palliative Care Visit: Palliative care will continue to follow for complex medical decision making, advance care planning, and clarification of goals. Return 6-8 weeks or prn.  PPS: 50%  HOSPICE ELIGIBILITY/DIAGNOSIS: TBD  Chief Complaint: Initial palliative care visit for dementia  History obtained from review of Epic EMR, discussion with  Ms. Satterwhite, her husband and her care giver Pam.  HISTORY OF PRESENT ILLNESS:  Sharon Evans is a 60 y.o. 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 Tomi Likens (Neurology), initially diagnosed in 2019. Last visit was on 08/06/2020.  Family report ongoing decline in both function and cognition, more decline in function than cognition. Patient is totally dependent on family for all of her ADLs, she is however able to feed self finger foods. She has two caregivers who comes on alternating days. She is able to make her needs known but struggles with word finding. Family report patient with good appetite and mostly sleeps through the night. She currently ambulates without assistive device, no report of recent falls. No report of fever or chills.   Reviewed MRI reports from 02/19/2017 showed mild cortical volume loss.   I reviewed available labs, medications, imaging, studies and related documents from the EMR.  Records reviewed and summarized above.   ROS EYES: denies acute vision changes ENMT: denies dysphagia Cardiovascular: denies chest pain, denies DOE Pulmonary: denies cough, denies increased SOB Abdomen: endorses good appetite, denies constipation, endorses continence of bowel GU: denies dysuria, endorses continence of urine MSK:  endorsed weakness, no falls reported Skin: denies rashes or wounds Neurological:  denies pain, denies insomnia Psych: Endorses positive mood Heme/lymph/immuno: denies bruises, abnormal bleeding  Vitals with BMI 10/19/2020 08/06/2020 01/29/2020  Height - '5\' 2"'  -  Weight - 139 lbs 3 oz 140 lbs  BMI - 82.50 -  Systolic [phone]  Diastolic 80 68 71  Pulse 70 79 93    Physical Exam: Current and past weights:General: well appearing, cooperative, sitting on a couch in NAD EYES: anicteric sclera, no discharge  ENMT: intact hearing, oral mucous membranes moist CV: RRR, no LE edema Pulmonary: LCTA, no increased work of breathing, no cough, room air Abdomen: intake 100%, no ascites GU: deferred MSK: no sarcopenia, moves all extremities, ambulatory Skin: warm and dry, no rashes or wounds on visible skin Neuro:  moderate cognitive impairment Psych: non-anxious affect, A and alert, confused Hem/lymph/immuno: no widespread bruising  PAST MEDICAL HISTORY:  Active Ambulatory Problems    Diagnosis Date Noted   No Active Ambulatory Problems   Resolved Ambulatory Problems    Diagnosis Date Noted   No Resolved Ambulatory Problems   Past Medical History:  Diagnosis Date   Alzheimer's dementia ([place])    Basal cell carcinoma    Hypercholesterolemia    Hypertension    SOCIAL HX:  Social History   Tobacco Use   Smoking status: Never   Smokeless tobacco: Never  Substance Use Topics   Alcohol use: Yes    Alcohol/week: 2.0 standard drinks    Types: 1 Glasses of wine, 1 Cans of beer  per week    Comment: 1-2 on week ends   FAMILY HX:  Family History  Problem Relation Age of Onset   Heart disease Mother    Heart attack Father    Dementia Neg Hx      ALLERGIES: No Known Allergies    PERTINENT MEDICATIONS:  Outpatient Encounter Medications as of 10/19/2020  Medication    escitalopram (LEXAPRO) 10 MG tablet  Memantine HCl-Donepezil HCl (NAMZARIC) 28-10 MG CP24   Thank you for the opportunity to participate in the care of Ms. Shook.  The palliative care team  will continue to follow. Please call our office at [phone] if we can be of additional assistance.   Jari Favre, DNP, AGPCNP-BC  COVID-19 PATIENT SCREENING TOOL Asked and negative response unless otherwise noted:   Have you had symptoms of covid, tested positive or been in contact with someone with symptoms/positive test in the past 5-10 days?

## 2020-11-18 ENCOUNTER — Telehealth: Payer: Self-pay | Admitting: *Deleted

## 2020-11-18 NOTE — Telephone Encounter (Signed)
Sharon Evans, patient is under care of [facility].  Please call them at [phone] and see if they can eval for hospice at patients husband request (I believe that is what he wants?)

## 2020-11-18 NOTE — Telephone Encounter (Signed)
Pt is returning Sharon Evans's call. Michela Pitcher it is urgent that he speaks with her. Wife declining and he needs to know what to do.

## 2020-11-18 NOTE — Telephone Encounter (Signed)
Received a communication from our Palliative care administration staff that patient's husband, Sharon Evans called stating that she is declining. I called and spoke with Sharon Evans who reports that she has almost fallen twice and urinating on the floor. She can no longer converse, but does understand what is going on. Follow-up palliative care visit was planned for 12/03/20 but has been moved to tomorrow 11/19/20 @ 10a.

## 2020-11-18 NOTE — Telephone Encounter (Signed)
5:37p Received a call from patient's husband Gwyndolyn Saxon stating that he gave patient an in-home Covid-19 test and results were positive. Palliative care visit scheduled tomorrow 11/18/20'@10'$  changed from in-person to telehealth.

## 2020-11-19 ENCOUNTER — Other Ambulatory Visit: Payer: Self-pay

## 2020-11-19 ENCOUNTER — Other Ambulatory Visit: Payer: BC Managed Care – PPO

## 2020-11-19 ENCOUNTER — Other Ambulatory Visit: Payer: BC Managed Care – PPO | Admitting: *Deleted

## 2020-11-19 DIAGNOSIS — Z515 Encounter for palliative care: Secondary | ICD-10-CM

## 2020-11-22 NOTE — Progress Notes (Signed)
[facility] RN NOTE  PATIENT NAME: Sharon Evans DOB: [birth year] MRN: [id number]  PRIMARY CARE PROVIDER: Gaynelle Arabian, MD  RESPONSIBLE PARTY: Alford Highland (husband) Acct ID - Guarantor Home Phone Work Phone Relationship Acct Type  [id number] [name] Self P/F     [place], [place], [place] [address]   Due to the COVID-19 crisis, this virtual check-in visit was done via telephone from my office and it was initiated and consent by this patient and or family.  PLAN OF CARE and INTERVENTION:  ADVANCE CARE PLANNING/GOALS OF CARE: Goal is for patient to remain in her home throughout end of life. She has a DNR. PATIENT/CAREGIVER EDUCATION: Symptom management, Covid-19 management, safe mobility, fall prevention, s/s of infection DISEASE STATUS: Joint virtual check-in visit completed with LCSW, M. Lonon. Spoke with patient's husband as patient has dementia. Husband called yesterday stating that patient has almost fallen twice along with more urinary incontinence and he was not sure what to do. Patient keeps taking her brief off and urinating on the floor. In-person visit was scheduled for today, however patient was given an in-home Covid-19 test and the results were positive so visit was changed to virtual. Yesterday, patient was running a fever of 100.2. He has been giving Tylenol as needed and today she is afebrile. She has a dry cough and he is very concerned about her cough turning into pneumonia. She has a slight runny nose. Her appetite is ok. He says her speech is very poor and she is no longer able to eat with utensils. She must be given finger foods. He usually has a sitter present Monday-Friday for 20 to 25 hours per week and his sister-in-law helps on the weekend. However, since patient now has Covid-19, he is caring for her by himself which is somewhat overwhelming for him. I spoke with Dr. Hollace Kinnier to see if any other treatment  is recommended. She advised for husband to continue to give Tylenol as needed for pain/fever. She recommended that he use Robitussin to help with her dry cough. Encourage patient to drink fluids to keep secretions thin, and to make sure patient moves around and is not sedentary to help prevent pneumonia. She also advised that some of patient's recent increased confusion could be a result of Covid-19 infection. I called husband and left him a voicemail as requested to advise of MD recommendations and left my contact information in case he needed to call back with any additional questions/concerns.    HISTORY OF PRESENT ILLNESS:  This is a 60 yo female with a diagnosis of dementia. Palliative care team was asked to follow patient for symptom management, goals of care and complex decision making.   CODE STATUS: DNR ADVANCED DIRECTIVES: Y MOST FORM: no PPS: 50%   (Duration of visit and documentation 30 minutes)   Sharon Eastern, RN BSN

## 2020-11-23 NOTE — Progress Notes (Signed)
[facility] PALLIATIVE CARE SW NOTE  PATIENT NAME: Sharon Evans DOB: Apr 15, 1960 MRN: [id number]  PRIMARY CARE PROVIDER: Gaynelle Arabian, MD  RESPONSIBLE PARTY:  Acct ID - Guarantor Home Phone Work Phone Relationship Acct Type  [id number] Sharon [phone] Self P/F     [place], [place], [place] [address]   Due to the COVID-19 crisis, this virtual check-in visit was done via telephone from my office and it was initiated and consent by this patient and or family.    PLAN OF CARE and INTERVENTIONS:             GOALS OF CARE/ ADVANCE CARE PLANNING:  Goal is for patient to remain in her home with her husband. Patient is a DNR. SOCIAL/EMOTIONAL/SPIRITUAL ASSESSMENT/ INTERVENTIONS:  SW and RN-Sharon Evans completed a virtual follow-up visit with patient. The visit was initially scheduled to be face-to-face, however patient tested positive for COVID. Patient's husband- Sharon Evans is her PCG and provided an update on patient status. PCG advised that patient had two near falls yesterday and has had an overall change in her condition and really did not know what to do. Patient is removing her briefs and urinating on the floor. Patient tested positive for COVID-19 via an in-home test. Patient was running a fever of 100.2 yesterday that was treated with Tylenol. She also had a runny nose. Her appetite remains stable. However, her speech is becoming more nonsensical  and difficult to understand. SW inquired about what supports did he and patient have. He explained that he does have a sitter and family members to assist him while he work, but they are unable to come now due to patient's positive COVID-19 diagnosis. SW reinforced access to palliative care support while assessing the needs and comfort of patient, coping and needs of caregiver; RN to follow-up with palliative care director regarding changes in patient condition for further follow-up/interventions.  PATIENT/CAREGIVER EDUCATION/ COPING:   PCG and Patient appear to be coping well.  PERSONAL EMERGENCY PLAN:  911 can be activated for emergencies. COMMUNITY RESOURCES COORDINATION/ HEALTH CARE NAVIGATION:  Patient has private caregivers. FINANCIAL/LEGAL CONCERNS/INTERVENTIONS:  None.     SOCIAL HX:  Social History   Tobacco Use   Smoking status: Never   Smokeless tobacco: Never  Substance Use Topics   Alcohol use: Yes    Alcohol/week: 2.0 standard drinks    Types: 1 Glasses of wine, 1 Cans of beer per week    Comment: 1-2 on week ends    CODE STATUS: DNR ADVANCED DIRECTIVES: Yes MOST FORM COMPLETE:  No HOSPICE EDUCATION PROVIDED: No  PPS: Patient is currently at risk for falls, having increased cognitive decline, intermittent incontinence episodes and is positive for COVID.   Duration of telephonic visit and documentation: 30 minutes  Sharon Puller, LCSW

## 2020-12-03 ENCOUNTER — Other Ambulatory Visit: Payer: Self-pay

## 2020-12-03 ENCOUNTER — Other Ambulatory Visit: Payer: BC Managed Care – PPO

## 2020-12-03 DIAGNOSIS — Z515 Encounter for palliative care: Secondary | ICD-10-CM

## 2020-12-03 NOTE — Progress Notes (Signed)
COMM[facility]ITY PALLIATIVE CARE SW NOTE  PATIENT NAME: Sharon Evans DOB: 06-20-1960 MRN: [id number]  PRIMARY CARE PROVIDER: Gaynelle Arabian, MD  RESPONSIBLE PARTY:  Acct ID - Guarantor Home Phone Work Phone Relationship Acct Type  [id number] Sharon [phone] Self P/F     [place], [place], [place] [address]   Due to the COVID-19 crisis, this virtual check-in visit was done via telephone from my office and it was initiated and consent by this patient and or family  PLAN OF CARE and INTERVENTIONS:             GOALS OF CARE/ ADVANCE CARE PLANNING:  Goal is for patient to remain at home with her husband. Patient is a DNR.  SOCIAL/EMOTIONAL/SPIRITUAL ASSESSMENT/ INTERVENTIONS:  SW completed a virtual check-in visit with patient/husband. Her husband reported that patient has recovered from [place] and appears to be back at her baseline.  Patient is eating better, she has to fed, but she is able to eat finger foods. Her swallowing has been normal. She is walking with at least one person assistance to walk, but she has been making efforts to take short walks daily. Her husband has noticed that her stamina has declined and she is easily fatigued.He described that her hallucinations are worsening as well as her verbal speech. Patient is minimally verbal as she is only able to say one word.  Patient is sleeping fine. Her husband report that overall patient's spirits are good. SW provided reassurance of ongoing support. He remains open to palliative care visit.  PATIENT/CAREGIVER EDUCATION/ COPING:  Patient and caregiver appear to be coping well.  PERSONAL EMERGENCY PLAN:  911 can be activated for emergencies.  COMMUNITY RESOURCES COORDINATION/ HEALTH CARE NAVIGATION:  Patient has private sitters.  FINANCIAL/LEGAL CONCERNS/INTERVENTIONS:  None.     SOCIAL HX:  Social History   Tobacco Use   Smoking status: Never   Smokeless tobacco: Never  Substance Use Topics   Alcohol use: Yes     Alcohol/week: 2.0 standard drinks    Types: 1 Glasses of wine, 1 Cans of beer per week    Comment: 1-2 on week ends    CODE STATUS: DNR ADVANCED DIRECTIVES: Yes MOST FORM COMPLETE:  No HOSPICE EDUCATION PROVIDED: NO  PPS: Patient is currently at risk for falls, having increased cognitive decline, intermittent incontinence episodes.   Duration of telephonic visit and documentation: 30 minutes      Katheren Puller, LCSW

## 2020-12-22 ENCOUNTER — Other Ambulatory Visit: Payer: BC Managed Care – PPO

## 2020-12-22 ENCOUNTER — Other Ambulatory Visit: Payer: Self-pay

## 2020-12-22 ENCOUNTER — Other Ambulatory Visit: Payer: BC Managed Care – PPO | Admitting: *Deleted

## 2020-12-22 VITALS — BP 140/83 | HR 73 | Temp 97.9°F | Resp 14

## 2020-12-22 DIAGNOSIS — Z515 Encounter for palliative care: Secondary | ICD-10-CM

## 2020-12-22 NOTE — Progress Notes (Signed)
[facility]ITY PALLIATIVE CARE SW NOTE  PATIENT NAME: Sharon Evans DOB: October 02, 1960 MRN: [id number]  PRIMARY CARE PROVIDER: Gaynelle Arabian, MD  RESPONSIBLE PARTY:  Acct ID - Guarantor Home Phone Work Phone Relationship Acct Type  [id number] [name] Self P/F     [place], [place], [place] [address]     PLAN OF CARE and INTERVENTIONS:             GOALS OF CARE/ ADVANCE CARE PLANNING:  Goal is for patient to remain in her home. Patient is a DNR. SOCIAL/EMOTIONAL/SPIRITUAL ASSESSMENT/ INTERVENTIONS:  SW and RN-completed a face-to-face visit with patient at her home. She was present with her husband and sitter. Patient was awake, alert, engaged and appears to be in good spirits. She was verbally responsive to simple yes/no questions, with a small delay. She denied pain . Patient reported that she has been feeling well. Patient reported no issues with shortness of breath. He appetite remains good eating 3 solid meals with snacks in  between and includes 1 Ensure per day. Her weight has been consistent. Patient ambulates independently and spends at least 30 minutes a day exercising by walking. Patient requires assistance with dressing and personal cl care. The has intermittent incontinence. She is sleeping well and night and does not nap during the day. Patient has no swallowing issues and takes her medications without any problems. Patient is able to go for lunch/dinner with her private caregivers, friends and family. Patient has private caregivers 4-5x's a week in the mornings 9 am-12 pm and 5/6 hours in the evenings. Patient is scheduled to see her PCP on tomorrow in where she will  have labs drawn. Her husband coordinates her care and has done a great job. He verbalized no concerns.  PATIENT/CAREGIVER EDUCATION/ COPING:  Patient appear to be in good spirits and coping well. She and her husband have a great network of family, friends and faith community.  PERSONAL EMERGENCY  PLAN:  911 can be activated for emergencies. COMMUNITY RESOURCES COORDINATION/ HEALTH CARE NAVIGATION:  Patient has private caregivers 4/5 days a week. FINANCIAL/LEGAL CONCERNS/INTERVENTIONS:  None.     SOCIAL HX:  Social History   Tobacco Use   Smoking status: Never   Smokeless tobacco: Never  Substance Use Topics   Alcohol use: Yes    Alcohol/week: 2.0 standard drinks    Types: 1 Glasses of wine, 1 Cans of beer per week    Comment: 1-2 on week ends    CODE STATUS: DNR ADVANCED DIRECTIVES: No MOST FORM COMPLETE:  No HOSPICE EDUCATION PROVIDED: No  PPS: Patient is alert and oriented to self and situation. She ambulates independently, but requires assistance with dressing and personal care.   Duration of visit and documentation: 60 minutes   Katheren Puller, LCSW

## 2020-12-22 NOTE — Progress Notes (Signed)
[facility] RN NOTE  PATIENT NAME: Sharon Evans DOB: 19-Sep-1960 MRN: [id number]  PRIMARY CARE PROVIDER: Gaynelle Arabian, MD  RESPONSIBLE PARTY: Alford Highland (husband) Acct ID - Guarantor Home Phone Work Phone Relationship Acct Type  [id number] ASHAUNTI, [phone] Self P/F     [place], [place], [place] [address]   Covid-19 Pre-screening Negative  PLAN OF CARE and INTERVENTION:  ADVANCE CARE PLANNING/GOALS OF CARE: Goal is for patient to remain in her home and have a good quality of life. She has a DNR. PATIENT/CAREGIVER EDUCATION: Symptom management, safe mobility DISEASE STATUS: Joint visit made with LCSW, M. Lonon. Met with patient, husband and private caregiver in the home. Patient is alert and oriented x 3. She is very pleasant, laughing throughout visit and engaging. She does have some word-finding difficulties mixed with some unintelligible speech at times, but most of conversation today was understood. She denies pain or shortness of breath. She is ambulatory without the use of assistive devices. No recent falls. Caregiver takes patient for a 20-30 min walk daily when weather permits. She requires assistance with bathing and dressing. Her appetite is good. She drinks 1 Ensure daily for nutritional supplementation. Her weight has been stable. No dysphagia and takes all medications without difficulty. She is sleeping well throughout the night. Bowel movements are regular and normal consistency. Decrease in urinary incontinence. Patient had Covid last month and was more confused, experiencing increased episodes of incontinence and had a dry cough. Symptoms have significantly improved. She has a private caregiver 4 days/week in the mornings and 4-5 days/week in the evenings. She enjoys travelling outside of the home whenever possible. She has an appointment tomorrow with her PCP and to have labs drawn. Overall husband and caregiver feel patient is  doing well.  HISTORY OF PRESENT ILLNESS: This is a 60 yo female with a diagnosis of dementia. Palliative care continues to follow patient for assistance with symptom management, goals of care and complex decision making.  CODE STATUS: DNR ADVANCED DIRECTIVES: Y MOST FORM: no PPS: 50%   PHYSICAL EXAM:   VITALS: Today's Vitals   12/22/20 1051  BP: 140/83  Pulse: 73  Resp: 14  Temp: 97.9 F (36.6 C)  TempSrc: Temporal  SpO2: 98%  PainSc: 0-No pain    LUNGS: clear to auscultation  CARDIAC: Cor RRR EXTREMITIES: No edema SKIN: Skin color, texture, turgor normal. No rashes or lesions  NEURO:  Alert and oriented x 2, pleasant mood, some word-finding difficulties, mild confusion, ambulatory   (Duration of visit and documentation 60 minutes)    Daryl Eastern, RN BSN

## 2020-12-23 DIAGNOSIS — I10 Essential (primary) hypertension: Secondary | ICD-10-CM | POA: Diagnosis not present

## 2020-12-23 DIAGNOSIS — Z0000 Encounter for general adult medical examination without abnormal findings: Secondary | ICD-10-CM | POA: Diagnosis not present

## 2020-12-23 DIAGNOSIS — E7800 Pure hypercholesterolemia, unspecified: Secondary | ICD-10-CM | POA: Diagnosis not present

## 2021-01-21 ENCOUNTER — Other Ambulatory Visit: Payer: Self-pay | Admitting: Neurology

## 2021-02-07 NOTE — Progress Notes (Deleted)
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  She is accompanied by her husband and caregiver who supplements history.  I spoke with her caregiver, Pam, via phone.    UPDATE: Current medication:  Namzaric, Lexapro   Her caregiver works full-time during the day.  She has another caregiver that helps at night.  She also has started receiving palliative care.  She is not walking as much .  Appetite is good but has lost her eating skills (using utensils) and needs to be fed her food.  She is now completely dependent on all ADL.  She needs assistance in appropriately sitting down in a booth or on the commode.  She has increased trouble getting words out, almost non-verbal  She has been pulling down her Depends and urinating on the floor.  She has increased confusion and has had falls.       HISTORY: She has been concerned about her memory since late 2017.  Initially, she would at times forget her purse when leaving the house.  Other times, she would stop mid-sentence because she can't get the word out (not word-finding difficulty).  She appeared anxious and tremulous, so she was started on an SSRI for anxiety.  Her anxiety improved and she was no longer stuttering to find words.  She does report continued stress, however.  More recently, her husband endorsed concerns about her memory.  On a couple of occasions, she heated food in a ceramic bowl on the stove and burnt the bowl.  She has struggled trying to open her locked car for a couple of minutes before her husband had to remind her that she forgot the keys.  She is having increased trouble  remembering where common items are in the house.  She had missed paying her credit card balance, which is new.  She still drives without difficulty.  Her husband has not been concerned riding in the passenger seat with her.  However, one time she was driving in the car with her son when she missed a turn and had to drive the wrong way on a street until she was able to maneuver and turn around.  This upset her son.  She is more tremulous and is having trouble signing her name.  She denies headaches, visual disturbance, gait disturbance or falls.   MRI of brain with and without contrast from 02/19/17 was personally reviewed and revealed mild cortical volume loss along the convexities.  She underwent neuropsychological testing on 03/23/17, which demonstrated severe and global cognitive impairment, likely consistent with early-onset Alzheimer's disease.  Due to atypical presentation, she underwent repeat neuropsychological testing which demonstrated predominantly parietal signs such as apraxia, as well as Gerstmann's syndrome.  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  We discussed ordering a daTscan but patient and husband deferred for the time being as there wouldn't be any change in management.   She graduated college with a Water quality scientist.  She used to work in Nurse, children's.  She drinks maybe one beer a week.  She does not use illicit drugs.  There is no known family  history of dementia or other neurodegenerative disease.   Labs include TSH 2.29, B12 311.   Her husband is POA for both health care and finances.  PAST MEDICAL HISTORY: Past Medical History:  Diagnosis Date   Alzheimer's dementia ([place])    Basal cell carcinoma    Hypercholesterolemia    Hypertension     MEDICATIONS: Current Outpatient Medications on File Prior to Visit  Medication  Sig Dispense Refill   alendronate (FOSAMAX) 70 MG tablet Take 70 mg by mouth once a week.  11   escitalopram (LEXAPRO) 10 MG tablet Take 1 tablet (10 mg total) by mouth daily. 30 tablet 5   NAMZARIC 28-10 MG CP24 TAKE 1 CAPSULE BY MOUTH DAILY 30 capsule 0   No current facility-administered medications on file prior to visit.    ALLERGIES: No Known Allergies  FAMILY HISTORY: Family History  Problem Relation Age of Onset   Heart disease Mother    Heart attack Father    Dementia Neg Hx       Objective:  *** General: No acute distress.  Patient appears ***-groomed.   Head:  Normocephalic/atraumatic Eyes:  Fundi examined but not visualized Neck: supple, no paraspinal tenderness, full range of motion Heart:  Regular rate and rhythm Lungs:  Clear to auscultation bilaterally Back: No paraspinal tenderness Neurological Exam: alert and oriented to person, place, and time.  Speech fluent and not dysarthric, language intact.  CN II-XII intact. Bulk and tone normal, muscle strength 5/5 throughout.  Sensation to light touch intact.  Deep tendon reflexes 2+ throughout, toes downgoing.  Finger to nose testing intact.  Gait normal, Romberg negative.   Metta Clines, DO  CC: ***

## 2021-02-08 ENCOUNTER — Ambulatory Visit: Payer: BC Managed Care – PPO | Admitting: Neurology

## 2021-02-09 ENCOUNTER — Telehealth: Payer: Self-pay

## 2021-02-09 NOTE — Telephone Encounter (Signed)
(  3:45 pm) Follow-up palliative care visit with RN/SW team scheduled for 02/16/21 @ 10  am.

## 2021-02-16 ENCOUNTER — Other Ambulatory Visit: Payer: BC Managed Care – PPO | Admitting: *Deleted

## 2021-02-16 ENCOUNTER — Other Ambulatory Visit: Payer: BC Managed Care – PPO

## 2021-02-16 ENCOUNTER — Other Ambulatory Visit: Payer: Self-pay

## 2021-02-16 DIAGNOSIS — Z515 Encounter for palliative care: Secondary | ICD-10-CM

## 2021-02-16 NOTE — Progress Notes (Signed)
[facility]ALLIATIVE [facility]ARE SW NOTE  PATIENT NAME: Sharon Evans DOB: 1960-10-07 MRN: [id number]  PRIMARY CARE PROVIDER: Gaynelle Arabian, MD  RESPONSIBLE PARTY:  Acct ID - Guarantor Home Phone Work Phone Relationship Acct Type  [id number] [name] Self P/F     [place], [place], [place] [address]     PLAN OF CARE and INTERVENTIONS:             GOALS OF CARE/ ADVANCE CARE PLANNING:  Goals is for patient to remain in her home. Patient is a DNR.  SOCIAL/EMOTIONAL/SPIRITUAL ASSESSMENT/ INTERVENTIONS:   SW and RN-completed a face-to-face  follow-up visit with patient at her home. She was present with her husband and sitter. Patient was up, awake, alert, engaged and appeared to be in good spirits. She was verbally responsive and engaged with short, fragmented statements with a small delay. She denied pain. Patient reported that she has been feeling well and she continues to have physical exercise daily by walking and going out for dinner and shopping. Patient reported no issues with shortness of breath. He appetite remains good eating 3 solid meals with snacks and includes 1 Ensure per day. Her weight has been consistent.  Patient continues to require assistance with dressing and personal cl care. She continues to have minimal intermittent incontinence. She is sleeping well and night and does not nap during the day. Patient has no swallowing issues and takes her medications without any problems. Patient continues to go for lunch/dinner with her private caregivers, friends and family. Patient has private caregivers 4-5x's a week in the mornings 9 am-12 pm and 5/6 hours in the evenings.  PATIENT/CAREGIVER EDUCATION/ COPING:  Patient is coping well. Her husband is supportive husband and friend network. PERSONAL EMERGENCY PLAN:  911 can be activated for emergencies. COMMUNITY RESOURCES COORDINATION/ HEALTH CARE NAVIGATION:  Patient has private caregivers 4/5 days a week.  Patient's husband is her primary caregiver.  FINANCIAL/LEGAL CONCERNS/INTERVENTIONS:  None.     SOCIAL HX:  Social History   Tobacco Use   Smoking status: Never   Smokeless tobacco: Never  Substance Use Topics   Alcohol use: Yes    Alcohol/week: 2.0 standard drinks    Types: 1 Glasses of wine, 1 Cans of beer per week    Comment: 1-2 on week ends    CODE STATUS: DNR ADVANCED DIRECTIVES: No MOST FORM COMPLETE: No HOSPICE EDUCATION PROVIDED: No  PPS: Patient is alert and oriented to self and situation. She ambulates independently, but requires assistance with dressing and personal care.    Duration of visit and documentation: 60 minutes    Katheren Puller, LCSW

## 2021-02-23 ENCOUNTER — Other Ambulatory Visit: Payer: Self-pay | Admitting: Neurology

## 2021-03-25 ENCOUNTER — Other Ambulatory Visit: Payer: Self-pay | Admitting: Neurology

## 2021-03-28 ENCOUNTER — Encounter: Payer: Self-pay | Admitting: *Deleted

## 2021-03-28 ENCOUNTER — Other Ambulatory Visit: Payer: Self-pay

## 2021-03-28 ENCOUNTER — Other Ambulatory Visit: Payer: BC Managed Care – PPO | Admitting: *Deleted

## 2021-03-28 VITALS — BP 108/82 | HR 73 | Temp 98.2°F | Resp 18

## 2021-03-28 DIAGNOSIS — F419 Anxiety disorder, unspecified: Secondary | ICD-10-CM | POA: Insufficient documentation

## 2021-03-28 DIAGNOSIS — F0390 Unspecified dementia without behavioral disturbance: Secondary | ICD-10-CM | POA: Insufficient documentation

## 2021-03-28 DIAGNOSIS — I10 Essential (primary) hypertension: Secondary | ICD-10-CM | POA: Insufficient documentation

## 2021-03-28 DIAGNOSIS — G300 Alzheimer's disease with early onset: Secondary | ICD-10-CM | POA: Diagnosis not present

## 2021-03-28 DIAGNOSIS — E7800 Pure hypercholesterolemia, unspecified: Secondary | ICD-10-CM | POA: Insufficient documentation

## 2021-03-28 DIAGNOSIS — Z85828 Personal history of other malignant neoplasm of skin: Secondary | ICD-10-CM | POA: Insufficient documentation

## 2021-03-28 DIAGNOSIS — G3185 Corticobasal degeneration: Secondary | ICD-10-CM | POA: Insufficient documentation

## 2021-03-28 DIAGNOSIS — F02B4 Dementia in other diseases classified elsewhere, moderate, with anxiety: Secondary | ICD-10-CM | POA: Diagnosis not present

## 2021-03-28 NOTE — Progress Notes (Signed)
[facility] Consult Note Telephone: [phone]  Fax: [phone]    Date of encounter: 03/28/21 5:14 PM PATIENT NAME: Sharon Evans [place] [address]   [phone] (home) [phone] (work) DOB: January 07, 1961 MRN: [id number] PRIMARY CARE PROVIDER:    Gaynelle Arabian, MD,  [address] [place] [address] [phone]  REFERRING PROVIDER:   Gaynelle Arabian, MD [address] [place],   [address] [phone]  RESPONSIBLE PARTY:    Contact Information     Name Relation Home Work Mobile   Sharon, Evans [phone]  [phone]        I met face to face with patient and spouse, Sharon Evans in their home. Palliative Care was asked to follow this patient by consultation request of  Sharon Arabian, MD to address advance care planning and complex medical decision making. This is a follow up visit.  ASSESSMENT, SYMPTOM MANAGEMENT AND PLAN / RECOMMENDATIONS:  Dementia-sundowning with some anxiety/restlessness and wandering at bedtime, difficulty getting to sleep.  Continue current Namzaric 28-10 mg capsule.  Start Melatonin 3 mg nightly 2-3 hours before bedtime. Advised to monitor for increase in agitation, effectiveness.  If effective but does not make pt sufficiently sleepy can increase to 2 capsules (6 mg) and continue monitoring.  Educated caregiver signs/symptoms of infection with worsening mental status, agitation and confusion but would last during day.  Advised to notify provider if issues (side effects, ineffective or worsening). Advised to be alert to potential for patient to wander away from home. 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  Continue Escitalopram 10 mg daily.  Can also increase dose, if needed but will introduce 1 change at a time to monitor effectiveness. 3.   Caregiver stress-Bill, spouse (primary caregiver) advised to check for videos by Sharon Evans, OT that show how to address when patients with dementia are unable to carry out their normal ADLs.  Encouraged to mimic the behavior for her, redirect her before she becomes agitated, include her in discussions with remote recall children/holidays past, play holiday music for her if she likes it.  He has hired new caregivers for extra support so now pt has caregivers Monday through Saturday (coming through [facility]).  Encouraged him to take advantage of some of the times when caregivers in the home to do some errands he needs to complete or follow up on exercise for self care. Advised it is normal for patient to need reassurance of his presence as this is what she is most familiar with but as she becomes more accustomed/comfortable with caregivers that he can use that time to do things for self care.4  Advance Care Planning/Goals of Care: Not discussed at this visit. Per review of prior assigned NP's notes, pt is a DNR which has been completed and left with the patient/family previously.   MOST discussion was started and blank copy left for spouse on prior visit but pt became upset at that time with this discussion. Will follow up on this at next visit.  CODE STATUS: DNR      Follow up Palliative Care Visit: Palliative care will continue to follow for complex medical decision making, advance care planning, and clarification of goals. Return 4 weeks or prn to follow up current plan for sleep/sundowning.  I spent 50 minutes providing this consultation. More than 50% of the time in this consultation was spent in counseling and care coordination.  This visit  was coded based on medical decision making (MDM).  PPS: 60%  HOSPICE ELIGIBILITY/DIAGNOSIS: TBD  Chief Complaint:  Spouse, Bill, had contacted nurse Sharon Evans to request visit to manage new symptoms. He states pt having "hallucinations" with being restless and anxious and that this symptom has been present for the last  couple of weeks but has been slightly worse recently.  Of note the patient is unable to do household chores and spouse does not think she knows how to open the door locks but was wandering around the home last night "looking for something."  Pt states not being sure what she was looking for but not being upset.  Spouse states she appears like she has forgotten how to lie down in bed.  He states she does not remember how to fold laundry and at times is upsetting to her if she tries to do something and cannot complete the task.  He states she feeds herself finger foods, has some difficulty remembering when she has gone to the bathroom if she voided or had a bowel movement but he does pericare for her.  He has noted she is more dependent on him and can become more easily unsettled if he is not where she can see him.  Pt states not knowing what she was looking for and that her dreams/restlessness is not distressing to her.  He had independently picked up a bottle of Melatonin earlier in the day. Patient laughing, making jokes and at times has difficulty expressing her thoughts. Has some incontinence of bladder intermittently and expressed she doesn't like wearing depends. She would intermittently extend her leg, has trouble with not talking or moving while attempting to obtain BP.  HISTORY OF PRESENT ILLNESS:  Sharon Evans is a 60 y.o. 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 She has a hx of non-melanoma skin cancer. She is being followed by Neurologist-Sharon Evans.  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  Spouse Sharon Evans is her health care POA.  History obtained from review of EMR, discussion with primary team, and  interview with family, and [name].  I reviewed available labs, medications, imaging, studies and related documents from the EMR.  Records reviewed and summarized above.   ROS  General: NAD EYES: denies vision changes ENMT: denies dysphagia Cardiovascular: denies chest pain, denies DOE Pulmonary: denies cough, denies increased SOB Abdomen: endorses good appetite, denies constipation, endorses continence of bowel GU: denies dysuria, endorses continence of urine except intermittently MSK:  denies weakness,  no falls reported Skin: denies rashes or wounds Neurological: denies pain, and has trouble going to sleep in the evening Psych: Endorses positive mood.  Spouse indicates some increase in anxiety particularly in the evening and when he is not where she can directly see him Heme/lymph/immuno: denies bruises, abnormal bleeding  Physical Exam: Current and past weights: stable at 139 lbs Constitutional: NAD General: WNWD Caucasian female EYES: anicteric sclera, lids intact, no discharge  ENMT: intact hearing, oral mucous membranes moist, dentition intact CV: S1S2, RRR, no LE edema Pulmonary: CTAB, no increased work of breathing, no cough, room air Abdomen: intake 100%, normo-active BS + 4 quadrants, soft and non tender, no ascites GU: deferred MSK: no sarcopenia, moves all extremities, ambulatory Skin: warm and dry,  no rashes or wounds on visible skin Neuro:  no generalized weakness,  noted cognitive impairment-answers most questions yes/no, notes short term memory loss and difficulty in following multistep directions Psych: appears mild anxious affect as evidenced by restless movements/at times laughs instead of answering question, A and O to self and place Hem/lymph/immuno: no widespread bruising   Thank you for the opportunity to participate in the care of Ms. Abrell.  The palliative care team will continue to follow. Please call our office at [phone] if we can be of  additional assistance.   Marijo Conception, FNP   COVID-19 PATIENT SCREENING TOOL Asked and negative response unless otherwise noted:   Have you had symptoms of covid, tested positive or been in contact with someone with symptoms/positive test in the past 5-10 days?  no

## 2021-04-11 NOTE — Progress Notes (Signed)
[facility]ALLIATIVE C[facility] RN NOTE  PATIENT NAME: Sharon Evans DOB: February 15, 1961 MRN: [id number]  PRIMARY CARE PROVIDER: Gaynelle Arabian, MD  RESPONSIBLE PARTY:  Acct ID - Guarantor Home Phone Work Phone Relationship Acct Type  [id number] PERSEPHANIE, [phone] Self P/F     [place], [place], [place] [address]   Covid-19 Pre-screening Negative  PLAN OF CARE and INTERVENTION:  ADVANCE CARE PLANNING/GOALS OF CARE: Goal is for patient to remain at home with her husband. She has a DNR. PATIENT/CAREGIVER EDUCATION: Symptom management, safe mobility DISEASE STATUS: Joint follow up palliative care visit completed with LCSW, M. Lonon. Met with patient, husband and hired caregiver in their home. Patient is alert and oriented to person/place. She is very pleasant, smiling and laughing throughout visit. She does have some confusion and word-finding difficulties. At times speech can be garbled and difficult to understand. She does have some difficulties following commands. She is ambulatory without the use of assistive devices. She does requires assistance with bathing and dressing. She goes walking almost daily with caregiver, along with shopping and out to dinner with her husband. She continues with a good appetite with no dysphagia. She eats 3 meals/day and one Ensure for nutritional supplementation. She takes her medications as prescribed without difficulty. She has occasional urinary incontinence. No issues with her bowels. She continues with a private caregiver 4-5x's/week. Husband feels that overall her condition has been stable. Will continue to monitor.  HISTORY OF PRESENT ILLNESS:  This is a 61 yo female with a diagnosis of dementia. Palliative care continues to follow patient for assistance with symptom management, goals of care and complex decision making.  CODE STATUS: DNR ADVANCED DIRECTIVES: Y MOST FORM: no PPS: 50%   PHYSICAL EXAM:   LUNGS: clear to  auscultation  CARDIAC: Cor RRR EXTREMITIES: No edema SKIN: Skin color, texture, turgor normal. No rashes or lesions  NEURO:  Alert and oriented x 2, intermittent confusion, word-finding difficulties, ambulatory   (Duration of visit and documentation 60 minutes)   Daryl Eastern, RN BSN

## 2021-04-20 ENCOUNTER — Other Ambulatory Visit: Payer: Self-pay

## 2021-04-20 ENCOUNTER — Other Ambulatory Visit: Payer: BC Managed Care – PPO | Admitting: *Deleted

## 2021-04-20 ENCOUNTER — Telehealth: Payer: Self-pay | Admitting: Family Medicine

## 2021-04-20 DIAGNOSIS — Z515 Encounter for palliative care: Secondary | ICD-10-CM

## 2021-04-20 DIAGNOSIS — F05 Delirium due to known physiological condition: Secondary | ICD-10-CM

## 2021-04-20 NOTE — Telephone Encounter (Signed)
Pt's husband called and left message that pt has not been sleeping for the last 2 days, has been more anxious and expressing visual hallucinations.  Denies fever, cough or SOB.  She is incontinent and may have UTI.  Caregivers have noted some hallucinations and anxiety during the day.  Nurse will go out tomorrow to obtain urine for UA to be able to treat if positive for infection.  Prescribed Haldol 1 mg Q 6 hrs prn anxiety/hallucinations and advised to either give 2 mg at HS or give 1 mg and if not sleepy in 1 hour, can give 2nd Haldol.  Dispense number 150, NR.  Pt has follow up visit scheduled for next week.  Jeweline Reif FNP-C

## 2021-04-21 ENCOUNTER — Other Ambulatory Visit: Payer: BC Managed Care – PPO | Admitting: *Deleted

## 2021-04-21 ENCOUNTER — Other Ambulatory Visit: Payer: Self-pay

## 2021-04-21 DIAGNOSIS — Z515 Encounter for palliative care: Secondary | ICD-10-CM

## 2021-04-21 DIAGNOSIS — N390 Urinary tract infection, site not specified: Secondary | ICD-10-CM | POA: Diagnosis not present

## 2021-04-21 DIAGNOSIS — F05 Delirium due to known physiological condition: Secondary | ICD-10-CM | POA: Diagnosis not present

## 2021-04-21 NOTE — Progress Notes (Signed)
[facility] RN NOTE  PATIENT NAME: Sharon Evans DOB: Feb 24, 1961 MRN: [id number]  PRIMARY CARE PROVIDER: Gaynelle Arabian, MD  RESPONSIBLE PARTY: Alford Highland (spouse) Acct ID - Guarantor Home Phone Work Phone Relationship Acct Type  [id number] [name] Self P/F     [place], [place], [place] [address]   Due to the COVID-19 crisis, this virtual check-in visit was done via telephone from my office and it was initiated and consent by this patient and or family.  RN follow up palliative care telephonic encounter completed with patient's husband Gwyndolyn Saxon. He reports that him and the caregivers are noticing that patient is experiencing increased confusion and hallucinations regarding a man in the house at various times during the day and are worse at night. She is also not sleeping well despite taking Melatonin. Husband is requesting an anti-anxiety medication to help her sleep.I have discussed this with Damaris Hippo, palliative NP who advised that she will prescribe Haldol as needed and requested a urine specimen be obtained to rule out a UTI. Husband is agreeable to this plan. Visit scheduled for tomorrow 04/21/21@10a .   (Duration of visit and documentation 30 minutes)   Daryl Eastern, RN BSN

## 2021-04-25 DIAGNOSIS — I10 Essential (primary) hypertension: Secondary | ICD-10-CM | POA: Diagnosis not present

## 2021-04-25 DIAGNOSIS — G309 Alzheimer's disease, unspecified: Secondary | ICD-10-CM | POA: Diagnosis not present

## 2021-04-25 DIAGNOSIS — Z6821 Body mass index (BMI) 21.0-21.9, adult: Secondary | ICD-10-CM | POA: Diagnosis not present

## 2021-04-25 DIAGNOSIS — R634 Abnormal weight loss: Secondary | ICD-10-CM | POA: Diagnosis not present

## 2021-04-25 DIAGNOSIS — F0283 Dementia in other diseases classified elsewhere, unspecified severity, with mood disturbance: Secondary | ICD-10-CM | POA: Diagnosis not present

## 2021-04-25 DIAGNOSIS — E43 Unspecified severe protein-calorie malnutrition: Secondary | ICD-10-CM | POA: Diagnosis not present

## 2021-04-25 DIAGNOSIS — E785 Hyperlipidemia, unspecified: Secondary | ICD-10-CM | POA: Diagnosis not present

## 2021-04-26 DIAGNOSIS — I10 Essential (primary) hypertension: Secondary | ICD-10-CM | POA: Diagnosis not present

## 2021-04-26 DIAGNOSIS — E43 Unspecified severe protein-calorie malnutrition: Secondary | ICD-10-CM | POA: Diagnosis not present

## 2021-04-26 DIAGNOSIS — G309 Alzheimer's disease, unspecified: Secondary | ICD-10-CM | POA: Diagnosis not present

## 2021-04-26 DIAGNOSIS — R634 Abnormal weight loss: Secondary | ICD-10-CM | POA: Diagnosis not present

## 2021-04-26 DIAGNOSIS — E785 Hyperlipidemia, unspecified: Secondary | ICD-10-CM | POA: Diagnosis not present

## 2021-04-26 DIAGNOSIS — Z6821 Body mass index (BMI) 21.0-21.9, adult: Secondary | ICD-10-CM | POA: Diagnosis not present

## 2021-04-26 DIAGNOSIS — F0283 Dementia in other diseases classified elsewhere, unspecified severity, with mood disturbance: Secondary | ICD-10-CM | POA: Diagnosis not present

## 2021-04-26 NOTE — Progress Notes (Signed)
[facility] RN NOTE  PATIENT NAME: Sharon Evans DOB: [birth year] MRN: [id number]  PRIMARY CARE PROVIDER: Gaynelle Arabian, MD  RESPONSIBLE PARTY: Alford Highland (husband) Acct ID - Guarantor Home Phone Work Phone Relationship Acct Type  [id number] TIMMYA, [phone] Self P/F     [place], [place], [place] [address]   Covid-19 Pre-screening Negative  RN visit made as requested with patient, spouse and hired caregiver. Patient has been experiencing increased confusion and visual hallucinations. She is having difficulty sleeping at night despite taking Melatonin. Hired caregivers have noted hallucinations during the day. She does not recognize this to be her home most days. Damaris Hippo, NP prescribed patient Haldol and husband picked medication up this morning and plans to start this today. Upon arrival, patient is seen dancing in her bedroom. Pleasant mood. Word finding difficulties noted and increased inability to follow commands at times. No hallucinations present so far this morning. Obtained urinalysis and culture/sensitivities and took to the lab as requested by NP to r/o UTI. Husband also states that patient is having more issues with eating/feeding herself finger foods. More prompting and cueing required. NP to make follow up visit on 04/28/21.    (Duration of visit and documentation 45 minutes)   Daryl Eastern, RN BSN

## 2021-04-27 DIAGNOSIS — Z6821 Body mass index (BMI) 21.0-21.9, adult: Secondary | ICD-10-CM | POA: Diagnosis not present

## 2021-04-27 DIAGNOSIS — I10 Essential (primary) hypertension: Secondary | ICD-10-CM | POA: Diagnosis not present

## 2021-04-27 DIAGNOSIS — F0283 Dementia in other diseases classified elsewhere, unspecified severity, with mood disturbance: Secondary | ICD-10-CM | POA: Diagnosis not present

## 2021-04-27 DIAGNOSIS — R634 Abnormal weight loss: Secondary | ICD-10-CM | POA: Diagnosis not present

## 2021-04-27 DIAGNOSIS — E785 Hyperlipidemia, unspecified: Secondary | ICD-10-CM | POA: Diagnosis not present

## 2021-04-27 DIAGNOSIS — G309 Alzheimer's disease, unspecified: Secondary | ICD-10-CM | POA: Diagnosis not present

## 2021-04-27 DIAGNOSIS — E43 Unspecified severe protein-calorie malnutrition: Secondary | ICD-10-CM | POA: Diagnosis not present

## 2021-04-28 DIAGNOSIS — E785 Hyperlipidemia, unspecified: Secondary | ICD-10-CM | POA: Diagnosis not present

## 2021-04-28 DIAGNOSIS — F0283 Dementia in other diseases classified elsewhere, unspecified severity, with mood disturbance: Secondary | ICD-10-CM | POA: Diagnosis not present

## 2021-04-28 DIAGNOSIS — R634 Abnormal weight loss: Secondary | ICD-10-CM | POA: Diagnosis not present

## 2021-04-28 DIAGNOSIS — E43 Unspecified severe protein-calorie malnutrition: Secondary | ICD-10-CM | POA: Diagnosis not present

## 2021-04-28 DIAGNOSIS — I10 Essential (primary) hypertension: Secondary | ICD-10-CM | POA: Diagnosis not present

## 2021-04-28 DIAGNOSIS — G309 Alzheimer's disease, unspecified: Secondary | ICD-10-CM | POA: Diagnosis not present

## 2021-04-28 DIAGNOSIS — Z6821 Body mass index (BMI) 21.0-21.9, adult: Secondary | ICD-10-CM | POA: Diagnosis not present

## 2021-04-29 DIAGNOSIS — E43 Unspecified severe protein-calorie malnutrition: Secondary | ICD-10-CM | POA: Diagnosis not present

## 2021-04-29 DIAGNOSIS — R634 Abnormal weight loss: Secondary | ICD-10-CM | POA: Diagnosis not present

## 2021-04-29 DIAGNOSIS — I10 Essential (primary) hypertension: Secondary | ICD-10-CM | POA: Diagnosis not present

## 2021-04-29 DIAGNOSIS — Z6821 Body mass index (BMI) 21.0-21.9, adult: Secondary | ICD-10-CM | POA: Diagnosis not present

## 2021-04-29 DIAGNOSIS — F0283 Dementia in other diseases classified elsewhere, unspecified severity, with mood disturbance: Secondary | ICD-10-CM | POA: Diagnosis not present

## 2021-04-29 DIAGNOSIS — E785 Hyperlipidemia, unspecified: Secondary | ICD-10-CM | POA: Diagnosis not present

## 2021-04-29 DIAGNOSIS — G309 Alzheimer's disease, unspecified: Secondary | ICD-10-CM | POA: Diagnosis not present

## 2021-04-30 DIAGNOSIS — E785 Hyperlipidemia, unspecified: Secondary | ICD-10-CM | POA: Diagnosis not present

## 2021-04-30 DIAGNOSIS — R634 Abnormal weight loss: Secondary | ICD-10-CM | POA: Diagnosis not present

## 2021-04-30 DIAGNOSIS — I10 Essential (primary) hypertension: Secondary | ICD-10-CM | POA: Diagnosis not present

## 2021-04-30 DIAGNOSIS — G309 Alzheimer's disease, unspecified: Secondary | ICD-10-CM | POA: Diagnosis not present

## 2021-04-30 DIAGNOSIS — E43 Unspecified severe protein-calorie malnutrition: Secondary | ICD-10-CM | POA: Diagnosis not present

## 2021-04-30 DIAGNOSIS — Z6821 Body mass index (BMI) 21.0-21.9, adult: Secondary | ICD-10-CM | POA: Diagnosis not present

## 2021-04-30 DIAGNOSIS — F0283 Dementia in other diseases classified elsewhere, unspecified severity, with mood disturbance: Secondary | ICD-10-CM | POA: Diagnosis not present

## 2021-05-01 DIAGNOSIS — F0283 Dementia in other diseases classified elsewhere, unspecified severity, with mood disturbance: Secondary | ICD-10-CM | POA: Diagnosis not present

## 2021-05-01 DIAGNOSIS — E785 Hyperlipidemia, unspecified: Secondary | ICD-10-CM | POA: Diagnosis not present

## 2021-05-01 DIAGNOSIS — Z6821 Body mass index (BMI) 21.0-21.9, adult: Secondary | ICD-10-CM | POA: Diagnosis not present

## 2021-05-01 DIAGNOSIS — R634 Abnormal weight loss: Secondary | ICD-10-CM | POA: Diagnosis not present

## 2021-05-01 DIAGNOSIS — I10 Essential (primary) hypertension: Secondary | ICD-10-CM | POA: Diagnosis not present

## 2021-05-01 DIAGNOSIS — E43 Unspecified severe protein-calorie malnutrition: Secondary | ICD-10-CM | POA: Diagnosis not present

## 2021-05-01 DIAGNOSIS — G309 Alzheimer's disease, unspecified: Secondary | ICD-10-CM | POA: Diagnosis not present

## 2021-05-02 DIAGNOSIS — E43 Unspecified severe protein-calorie malnutrition: Secondary | ICD-10-CM | POA: Diagnosis not present

## 2021-05-02 DIAGNOSIS — Z6821 Body mass index (BMI) 21.0-21.9, adult: Secondary | ICD-10-CM | POA: Diagnosis not present

## 2021-05-02 DIAGNOSIS — R634 Abnormal weight loss: Secondary | ICD-10-CM | POA: Diagnosis not present

## 2021-05-02 DIAGNOSIS — G309 Alzheimer's disease, unspecified: Secondary | ICD-10-CM | POA: Diagnosis not present

## 2021-05-02 DIAGNOSIS — F0283 Dementia in other diseases classified elsewhere, unspecified severity, with mood disturbance: Secondary | ICD-10-CM | POA: Diagnosis not present

## 2021-05-02 DIAGNOSIS — I10 Essential (primary) hypertension: Secondary | ICD-10-CM | POA: Diagnosis not present

## 2021-05-02 DIAGNOSIS — E785 Hyperlipidemia, unspecified: Secondary | ICD-10-CM | POA: Diagnosis not present

## 2021-05-03 DIAGNOSIS — I10 Essential (primary) hypertension: Secondary | ICD-10-CM | POA: Diagnosis not present

## 2021-05-03 DIAGNOSIS — G309 Alzheimer's disease, unspecified: Secondary | ICD-10-CM | POA: Diagnosis not present

## 2021-05-03 DIAGNOSIS — E43 Unspecified severe protein-calorie malnutrition: Secondary | ICD-10-CM | POA: Diagnosis not present

## 2021-05-03 DIAGNOSIS — E785 Hyperlipidemia, unspecified: Secondary | ICD-10-CM | POA: Diagnosis not present

## 2021-05-03 DIAGNOSIS — F0283 Dementia in other diseases classified elsewhere, unspecified severity, with mood disturbance: Secondary | ICD-10-CM | POA: Diagnosis not present

## 2021-05-03 DIAGNOSIS — Z6821 Body mass index (BMI) 21.0-21.9, adult: Secondary | ICD-10-CM | POA: Diagnosis not present

## 2021-05-03 DIAGNOSIS — R634 Abnormal weight loss: Secondary | ICD-10-CM | POA: Diagnosis not present

## 2021-05-04 ENCOUNTER — Other Ambulatory Visit: Payer: Self-pay | Admitting: Neurology

## 2021-05-04 DIAGNOSIS — I10 Essential (primary) hypertension: Secondary | ICD-10-CM | POA: Diagnosis not present

## 2021-05-04 DIAGNOSIS — R634 Abnormal weight loss: Secondary | ICD-10-CM | POA: Diagnosis not present

## 2021-05-04 DIAGNOSIS — F0283 Dementia in other diseases classified elsewhere, unspecified severity, with mood disturbance: Secondary | ICD-10-CM | POA: Diagnosis not present

## 2021-05-04 DIAGNOSIS — G309 Alzheimer's disease, unspecified: Secondary | ICD-10-CM | POA: Diagnosis not present

## 2021-05-04 DIAGNOSIS — E43 Unspecified severe protein-calorie malnutrition: Secondary | ICD-10-CM | POA: Diagnosis not present

## 2021-05-04 DIAGNOSIS — Z6821 Body mass index (BMI) 21.0-21.9, adult: Secondary | ICD-10-CM | POA: Diagnosis not present

## 2021-05-04 DIAGNOSIS — E785 Hyperlipidemia, unspecified: Secondary | ICD-10-CM | POA: Diagnosis not present

## 2021-05-05 DIAGNOSIS — E43 Unspecified severe protein-calorie malnutrition: Secondary | ICD-10-CM | POA: Diagnosis not present

## 2021-05-05 DIAGNOSIS — I10 Essential (primary) hypertension: Secondary | ICD-10-CM | POA: Diagnosis not present

## 2021-05-05 DIAGNOSIS — Z6821 Body mass index (BMI) 21.0-21.9, adult: Secondary | ICD-10-CM | POA: Diagnosis not present

## 2021-05-05 DIAGNOSIS — F0283 Dementia in other diseases classified elsewhere, unspecified severity, with mood disturbance: Secondary | ICD-10-CM | POA: Diagnosis not present

## 2021-05-05 DIAGNOSIS — G309 Alzheimer's disease, unspecified: Secondary | ICD-10-CM | POA: Diagnosis not present

## 2021-05-05 DIAGNOSIS — R634 Abnormal weight loss: Secondary | ICD-10-CM | POA: Diagnosis not present

## 2021-05-05 DIAGNOSIS — E785 Hyperlipidemia, unspecified: Secondary | ICD-10-CM | POA: Diagnosis not present

## 2021-05-06 DIAGNOSIS — F0283 Dementia in other diseases classified elsewhere, unspecified severity, with mood disturbance: Secondary | ICD-10-CM | POA: Diagnosis not present

## 2021-05-06 DIAGNOSIS — E785 Hyperlipidemia, unspecified: Secondary | ICD-10-CM | POA: Diagnosis not present

## 2021-05-06 DIAGNOSIS — I10 Essential (primary) hypertension: Secondary | ICD-10-CM | POA: Diagnosis not present

## 2021-05-06 DIAGNOSIS — E43 Unspecified severe protein-calorie malnutrition: Secondary | ICD-10-CM | POA: Diagnosis not present

## 2021-05-06 DIAGNOSIS — Z6821 Body mass index (BMI) 21.0-21.9, adult: Secondary | ICD-10-CM | POA: Diagnosis not present

## 2021-05-06 DIAGNOSIS — G309 Alzheimer's disease, unspecified: Secondary | ICD-10-CM | POA: Diagnosis not present

## 2021-05-06 DIAGNOSIS — R634 Abnormal weight loss: Secondary | ICD-10-CM | POA: Diagnosis not present

## 2021-05-07 DIAGNOSIS — I10 Essential (primary) hypertension: Secondary | ICD-10-CM | POA: Diagnosis not present

## 2021-05-07 DIAGNOSIS — F0283 Dementia in other diseases classified elsewhere, unspecified severity, with mood disturbance: Secondary | ICD-10-CM | POA: Diagnosis not present

## 2021-05-07 DIAGNOSIS — G309 Alzheimer's disease, unspecified: Secondary | ICD-10-CM | POA: Diagnosis not present

## 2021-05-07 DIAGNOSIS — E785 Hyperlipidemia, unspecified: Secondary | ICD-10-CM | POA: Diagnosis not present

## 2021-05-07 DIAGNOSIS — R634 Abnormal weight loss: Secondary | ICD-10-CM | POA: Diagnosis not present

## 2021-05-07 DIAGNOSIS — E43 Unspecified severe protein-calorie malnutrition: Secondary | ICD-10-CM | POA: Diagnosis not present

## 2021-05-07 DIAGNOSIS — Z6821 Body mass index (BMI) 21.0-21.9, adult: Secondary | ICD-10-CM | POA: Diagnosis not present

## 2021-05-08 DIAGNOSIS — G309 Alzheimer's disease, unspecified: Secondary | ICD-10-CM | POA: Diagnosis not present

## 2021-05-08 DIAGNOSIS — I10 Essential (primary) hypertension: Secondary | ICD-10-CM | POA: Diagnosis not present

## 2021-05-08 DIAGNOSIS — Z6821 Body mass index (BMI) 21.0-21.9, adult: Secondary | ICD-10-CM | POA: Diagnosis not present

## 2021-05-08 DIAGNOSIS — F0283 Dementia in other diseases classified elsewhere, unspecified severity, with mood disturbance: Secondary | ICD-10-CM | POA: Diagnosis not present

## 2021-05-08 DIAGNOSIS — E43 Unspecified severe protein-calorie malnutrition: Secondary | ICD-10-CM | POA: Diagnosis not present

## 2021-05-08 DIAGNOSIS — E785 Hyperlipidemia, unspecified: Secondary | ICD-10-CM | POA: Diagnosis not present

## 2021-05-08 DIAGNOSIS — R634 Abnormal weight loss: Secondary | ICD-10-CM | POA: Diagnosis not present

## 2021-05-09 DIAGNOSIS — F0283 Dementia in other diseases classified elsewhere, unspecified severity, with mood disturbance: Secondary | ICD-10-CM | POA: Diagnosis not present

## 2021-05-09 DIAGNOSIS — E785 Hyperlipidemia, unspecified: Secondary | ICD-10-CM | POA: Diagnosis not present

## 2021-05-09 DIAGNOSIS — G309 Alzheimer's disease, unspecified: Secondary | ICD-10-CM | POA: Diagnosis not present

## 2021-05-09 DIAGNOSIS — Z6821 Body mass index (BMI) 21.0-21.9, adult: Secondary | ICD-10-CM | POA: Diagnosis not present

## 2021-05-09 DIAGNOSIS — E43 Unspecified severe protein-calorie malnutrition: Secondary | ICD-10-CM | POA: Diagnosis not present

## 2021-05-09 DIAGNOSIS — R634 Abnormal weight loss: Secondary | ICD-10-CM | POA: Diagnosis not present

## 2021-05-09 DIAGNOSIS — I10 Essential (primary) hypertension: Secondary | ICD-10-CM | POA: Diagnosis not present

## 2021-05-10 DIAGNOSIS — E43 Unspecified severe protein-calorie malnutrition: Secondary | ICD-10-CM | POA: Diagnosis not present

## 2021-05-10 DIAGNOSIS — E785 Hyperlipidemia, unspecified: Secondary | ICD-10-CM | POA: Diagnosis not present

## 2021-05-10 DIAGNOSIS — R634 Abnormal weight loss: Secondary | ICD-10-CM | POA: Diagnosis not present

## 2021-05-10 DIAGNOSIS — Z6821 Body mass index (BMI) 21.0-21.9, adult: Secondary | ICD-10-CM | POA: Diagnosis not present

## 2021-05-10 DIAGNOSIS — I10 Essential (primary) hypertension: Secondary | ICD-10-CM | POA: Diagnosis not present

## 2021-05-10 DIAGNOSIS — F0283 Dementia in other diseases classified elsewhere, unspecified severity, with mood disturbance: Secondary | ICD-10-CM | POA: Diagnosis not present

## 2021-05-10 DIAGNOSIS — G309 Alzheimer's disease, unspecified: Secondary | ICD-10-CM | POA: Diagnosis not present

## 2021-05-11 DIAGNOSIS — R634 Abnormal weight loss: Secondary | ICD-10-CM | POA: Diagnosis not present

## 2021-05-11 DIAGNOSIS — F0283 Dementia in other diseases classified elsewhere, unspecified severity, with mood disturbance: Secondary | ICD-10-CM | POA: Diagnosis not present

## 2021-05-11 DIAGNOSIS — E43 Unspecified severe protein-calorie malnutrition: Secondary | ICD-10-CM | POA: Diagnosis not present

## 2021-05-11 DIAGNOSIS — I10 Essential (primary) hypertension: Secondary | ICD-10-CM | POA: Diagnosis not present

## 2021-05-11 DIAGNOSIS — Z6821 Body mass index (BMI) 21.0-21.9, adult: Secondary | ICD-10-CM | POA: Diagnosis not present

## 2021-05-11 DIAGNOSIS — G309 Alzheimer's disease, unspecified: Secondary | ICD-10-CM | POA: Diagnosis not present

## 2021-05-11 DIAGNOSIS — E785 Hyperlipidemia, unspecified: Secondary | ICD-10-CM | POA: Diagnosis not present

## 2021-05-12 DIAGNOSIS — Z6821 Body mass index (BMI) 21.0-21.9, adult: Secondary | ICD-10-CM | POA: Diagnosis not present

## 2021-05-12 DIAGNOSIS — I10 Essential (primary) hypertension: Secondary | ICD-10-CM | POA: Diagnosis not present

## 2021-05-12 DIAGNOSIS — R634 Abnormal weight loss: Secondary | ICD-10-CM | POA: Diagnosis not present

## 2021-05-12 DIAGNOSIS — E785 Hyperlipidemia, unspecified: Secondary | ICD-10-CM | POA: Diagnosis not present

## 2021-05-12 DIAGNOSIS — E43 Unspecified severe protein-calorie malnutrition: Secondary | ICD-10-CM | POA: Diagnosis not present

## 2021-05-12 DIAGNOSIS — G309 Alzheimer's disease, unspecified: Secondary | ICD-10-CM | POA: Diagnosis not present

## 2021-05-12 DIAGNOSIS — F0283 Dementia in other diseases classified elsewhere, unspecified severity, with mood disturbance: Secondary | ICD-10-CM | POA: Diagnosis not present

## 2021-05-13 DIAGNOSIS — F0283 Dementia in other diseases classified elsewhere, unspecified severity, with mood disturbance: Secondary | ICD-10-CM | POA: Diagnosis not present

## 2021-05-13 DIAGNOSIS — G309 Alzheimer's disease, unspecified: Secondary | ICD-10-CM | POA: Diagnosis not present

## 2021-05-13 DIAGNOSIS — R634 Abnormal weight loss: Secondary | ICD-10-CM | POA: Diagnosis not present

## 2021-05-13 DIAGNOSIS — Z6821 Body mass index (BMI) 21.0-21.9, adult: Secondary | ICD-10-CM | POA: Diagnosis not present

## 2021-05-13 DIAGNOSIS — E43 Unspecified severe protein-calorie malnutrition: Secondary | ICD-10-CM | POA: Diagnosis not present

## 2021-05-13 DIAGNOSIS — E785 Hyperlipidemia, unspecified: Secondary | ICD-10-CM | POA: Diagnosis not present

## 2021-05-13 DIAGNOSIS — I10 Essential (primary) hypertension: Secondary | ICD-10-CM | POA: Diagnosis not present

## 2021-05-14 DIAGNOSIS — Z6821 Body mass index (BMI) 21.0-21.9, adult: Secondary | ICD-10-CM | POA: Diagnosis not present

## 2021-05-14 DIAGNOSIS — E785 Hyperlipidemia, unspecified: Secondary | ICD-10-CM | POA: Diagnosis not present

## 2021-05-14 DIAGNOSIS — F0283 Dementia in other diseases classified elsewhere, unspecified severity, with mood disturbance: Secondary | ICD-10-CM | POA: Diagnosis not present

## 2021-05-14 DIAGNOSIS — I10 Essential (primary) hypertension: Secondary | ICD-10-CM | POA: Diagnosis not present

## 2021-05-14 DIAGNOSIS — R634 Abnormal weight loss: Secondary | ICD-10-CM | POA: Diagnosis not present

## 2021-05-14 DIAGNOSIS — E43 Unspecified severe protein-calorie malnutrition: Secondary | ICD-10-CM | POA: Diagnosis not present

## 2021-05-14 DIAGNOSIS — G309 Alzheimer's disease, unspecified: Secondary | ICD-10-CM | POA: Diagnosis not present

## 2021-05-15 DIAGNOSIS — E785 Hyperlipidemia, unspecified: Secondary | ICD-10-CM | POA: Diagnosis not present

## 2021-05-15 DIAGNOSIS — E43 Unspecified severe protein-calorie malnutrition: Secondary | ICD-10-CM | POA: Diagnosis not present

## 2021-05-15 DIAGNOSIS — Z6821 Body mass index (BMI) 21.0-21.9, adult: Secondary | ICD-10-CM | POA: Diagnosis not present

## 2021-05-15 DIAGNOSIS — R634 Abnormal weight loss: Secondary | ICD-10-CM | POA: Diagnosis not present

## 2021-05-15 DIAGNOSIS — F0283 Dementia in other diseases classified elsewhere, unspecified severity, with mood disturbance: Secondary | ICD-10-CM | POA: Diagnosis not present

## 2021-05-15 DIAGNOSIS — I10 Essential (primary) hypertension: Secondary | ICD-10-CM | POA: Diagnosis not present

## 2021-05-15 DIAGNOSIS — G309 Alzheimer's disease, unspecified: Secondary | ICD-10-CM | POA: Diagnosis not present

## 2021-05-16 DIAGNOSIS — Z6821 Body mass index (BMI) 21.0-21.9, adult: Secondary | ICD-10-CM | POA: Diagnosis not present

## 2021-05-16 DIAGNOSIS — G309 Alzheimer's disease, unspecified: Secondary | ICD-10-CM | POA: Diagnosis not present

## 2021-05-16 DIAGNOSIS — E43 Unspecified severe protein-calorie malnutrition: Secondary | ICD-10-CM | POA: Diagnosis not present

## 2021-05-16 DIAGNOSIS — R634 Abnormal weight loss: Secondary | ICD-10-CM | POA: Diagnosis not present

## 2021-05-16 DIAGNOSIS — F0283 Dementia in other diseases classified elsewhere, unspecified severity, with mood disturbance: Secondary | ICD-10-CM | POA: Diagnosis not present

## 2021-05-16 DIAGNOSIS — I10 Essential (primary) hypertension: Secondary | ICD-10-CM | POA: Diagnosis not present

## 2021-05-16 DIAGNOSIS — E785 Hyperlipidemia, unspecified: Secondary | ICD-10-CM | POA: Diagnosis not present

## 2021-05-17 DIAGNOSIS — G309 Alzheimer's disease, unspecified: Secondary | ICD-10-CM | POA: Diagnosis not present

## 2021-05-17 DIAGNOSIS — E785 Hyperlipidemia, unspecified: Secondary | ICD-10-CM | POA: Diagnosis not present

## 2021-05-17 DIAGNOSIS — R634 Abnormal weight loss: Secondary | ICD-10-CM | POA: Diagnosis not present

## 2021-05-17 DIAGNOSIS — E43 Unspecified severe protein-calorie malnutrition: Secondary | ICD-10-CM | POA: Diagnosis not present

## 2021-05-17 DIAGNOSIS — F0283 Dementia in other diseases classified elsewhere, unspecified severity, with mood disturbance: Secondary | ICD-10-CM | POA: Diagnosis not present

## 2021-05-17 DIAGNOSIS — I10 Essential (primary) hypertension: Secondary | ICD-10-CM | POA: Diagnosis not present

## 2021-05-17 DIAGNOSIS — Z6821 Body mass index (BMI) 21.0-21.9, adult: Secondary | ICD-10-CM | POA: Diagnosis not present

## 2021-05-18 DIAGNOSIS — Z6821 Body mass index (BMI) 21.0-21.9, adult: Secondary | ICD-10-CM | POA: Diagnosis not present

## 2021-05-18 DIAGNOSIS — F0283 Dementia in other diseases classified elsewhere, unspecified severity, with mood disturbance: Secondary | ICD-10-CM | POA: Diagnosis not present

## 2021-05-18 DIAGNOSIS — I10 Essential (primary) hypertension: Secondary | ICD-10-CM | POA: Diagnosis not present

## 2021-05-18 DIAGNOSIS — E785 Hyperlipidemia, unspecified: Secondary | ICD-10-CM | POA: Diagnosis not present

## 2021-05-18 DIAGNOSIS — R634 Abnormal weight loss: Secondary | ICD-10-CM | POA: Diagnosis not present

## 2021-05-18 DIAGNOSIS — G309 Alzheimer's disease, unspecified: Secondary | ICD-10-CM | POA: Diagnosis not present

## 2021-05-18 DIAGNOSIS — E43 Unspecified severe protein-calorie malnutrition: Secondary | ICD-10-CM | POA: Diagnosis not present

## 2021-06-08 DEATH — deceased

## 2021-09-07 ENCOUNTER — Ambulatory Visit: Payer: Self-pay | Admitting: Neurology
